# Patient Record
Sex: Female | Born: 1980
Health system: Southern US, Community
[De-identification: ages and names within clinical notes are randomized; demographics above are authoritative.]

## PROBLEM LIST (undated history)

## (undated) DIAGNOSIS — I10 Essential (primary) hypertension: Secondary | ICD-10-CM

## (undated) DIAGNOSIS — R87629 Unspecified abnormal cytological findings in specimens from vagina: Secondary | ICD-10-CM

## (undated) DIAGNOSIS — Z789 Other specified health status: Secondary | ICD-10-CM

## (undated) HISTORY — DX: Other specified health status: Z78.9

## (undated) HISTORY — DX: Unspecified abnormal cytological findings in specimens from vagina: R87.629

## (undated) HISTORY — DX: Essential (primary) hypertension: I10

---
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
## 2000-11-10 ENCOUNTER — Other Ambulatory Visit: Admission: RE | Admit: 2000-11-10 | Discharge: 2000-11-10 | Payer: Self-pay | Admitting: Family Medicine

## 2014-06-28 ENCOUNTER — Emergency Department (HOSPITAL_COMMUNITY)
Admission: EM | Admit: 2014-06-28 | Discharge: 2014-06-28 | Disposition: A | Discharge status: Home / Self Care | Payer: Self-pay | Attending: Emergency Medicine | Admitting: Emergency Medicine

## 2014-06-28 ENCOUNTER — Emergency Department (HOSPITAL_COMMUNITY): Payer: Self-pay

## 2014-06-28 ENCOUNTER — Encounter (HOSPITAL_COMMUNITY): Payer: Self-pay

## 2014-06-28 DIAGNOSIS — Y9289 Other specified places as the place of occurrence of the external cause: Secondary | ICD-10-CM | POA: Insufficient documentation

## 2014-06-28 DIAGNOSIS — X58XXXA Exposure to other specified factors, initial encounter: Secondary | ICD-10-CM | POA: Insufficient documentation

## 2014-06-28 DIAGNOSIS — Y9389 Activity, other specified: Secondary | ICD-10-CM | POA: Insufficient documentation

## 2014-06-28 DIAGNOSIS — Z720 Tobacco use: Secondary | ICD-10-CM | POA: Insufficient documentation

## 2014-06-28 DIAGNOSIS — S93402A Sprain of unspecified ligament of left ankle, initial encounter: Secondary | ICD-10-CM | POA: Insufficient documentation

## 2014-06-28 DIAGNOSIS — Y998 Other external cause status: Secondary | ICD-10-CM | POA: Insufficient documentation

## 2014-06-28 MED ORDER — NAPROXEN 500 MG PO TABS: 500.0000 mg | ORAL_TABLET | Freq: Two times a day (BID) | ORAL | Status: DC

## 2014-06-28 MED ORDER — NAPROXEN 500 MG PO TABS
500.0000 mg | ORAL_TABLET | Freq: Once | ORAL | Status: AC
  Administered 2014-06-28: 500 mg via ORAL
  Filled 2014-06-28: qty 1

## 2014-06-28 NOTE — ED Notes (Signed)
Pt presents with c/o left ankle pain that started last Friday night. Pt reports she noticed some pain around that time which had gone away and came back last night. Pt denies any injury to that ankle.

## 2014-06-28 NOTE — ED Provider Notes (Signed)
CSN: [id number]     Arrival date & time 06/28/14  2003 History  This chart was scribed for non-physician practitioner, Teresa Madura, PA-C, working with Mancel Bale, MD, by Modena Jansky, ED Scribe. This patient was seen in room WTR7/WTR7 and the patient's care was started at 9:17 PM.   Chief Complaint  Patient presents with  . Ankle Pain   Patient is a 34 y.o. female presenting with ankle pain. The history is provided by the patient. No language interpreter was used.  Ankle Pain Location:  Ankle Time since incident:  6 days Injury: yes   Mechanism of injury comment:  Ambulation Ankle location:  L ankle Pain details:    Radiates to:  Does not radiate   Severity:  Moderate   Onset quality:  Sudden   Duration:  6 days   Timing:  Constant   Progression:  Waxing and waning Chronicity:  New Dislocation: no   Foreign body present:  No foreign bodies Relieved by:  Nothing Worsened by:  Bearing weight Ineffective treatments:  Elevation Associated symptoms: swelling    HPI Comments: Teresa Sanchez is a 34 y.o. female who presents to the Emergency Department complaining of constant moderate left ankle pain that started about 6 days ago. She reports that she heard a "pop" sound while she was ambulating to her car 6 days ago. She states that the next day she had pain only present with ambulation. She reports that 4 days ago the pain improved until 3 days ago she had left ankle pain even without ambulating. She states that she noticed some left ankle swelling and a slight red color. She reports using elevation with minimal relief. She denies any prior injury to ankle.   History reviewed. No pertinent past medical history. Past Surgical History  Procedure Laterality Date  . Cesarean section     No family history on file. History  Substance Use Topics  . Smoking status: Current Every Day Smoker  . Smokeless tobacco: Not on file  . Alcohol Use: Yes     Comment: occasionally    OB  History    No data available      Review of Systems  Musculoskeletal: Positive for myalgias and arthralgias.  All other systems reviewed and are negative.   Allergies  Review of patient's allergies indicates no known allergies.  Home Medications   Prior to Admission medications   Medication Sig Start Date End Date Taking? Authorizing Provider  naproxen (NAPROSYN) 500 MG tablet Take 1 tablet (500 mg total) by mouth 2 (two) times daily. 06/28/14   Teresa Madura, PA-C   BP 136/89 mmHg  Pulse 117  Temp(Src) 98.8 F (37.1 C) (Oral)  Resp 18  Ht  (1.753 m)  Wt 280 lb (127.007 kg)  BMI 41.33 kg/m2  SpO2 99%  LMP 06/04/2014 (Approximate)  Physical Exam  Constitutional: She is oriented to person, place, and time. She appears well-developed and well-nourished. No distress.  HENT:  Head: Normocephalic and atraumatic.  Eyes: Conjunctivae and EOM are normal. No scleral icterus.  Neck: Normal range of motion.  Cardiovascular: Normal rate, regular rhythm and intact distal pulses.   DP and PT pulses 2+ in the left lower extremity  Pulmonary/Chest: Effort normal. No respiratory distress.  Musculoskeletal: Normal range of motion.       Left ankle: She exhibits swelling. She exhibits normal range of motion, no deformity, no laceration and normal pulse. Tenderness. Lateral malleolus tenderness found. Achilles tendon normal.  Left lower leg: Normal.       Left foot: There is tenderness. There is normal range of motion, no bony tenderness, no crepitus and no deformity.       Feet:  Neurological: She is alert and oriented to person, place, and time. She exhibits normal muscle tone. Coordination normal.  Sensation to light touch intact. Patient name Teresa Sanchez with steady gait. Patellar and Achilles reflexes intact. Patient able to wiggle all toes of left foot.  Skin: Skin is warm and dry. No rash noted. She is not diaphoretic. No erythema. No pallor.  Psychiatric: She has a normal mood  and affect. Her behavior is normal.  Nursing note and vitals reviewed.   ED Course  Procedures (including critical care time) DIAGNOSTIC STUDIES: Oxygen Saturation is 99% on RA, normal by my interpretation.    COORDINATION OF CARE: 9:21 PM- Pt advised of plan for treatment which includes medication and radiology and pt agrees.  Labs Review Labs Reviewed - No data to display  Imaging Review Dg Ankle Complete Left  06/28/2014   CLINICAL DATA:  Acute onset ankle pain and popping 5 days ago. Initial encounter.  EXAM: LEFT ANKLE COMPLETE - 3+ VIEW  COMPARISON:  None.  FINDINGS: There is no evidence of fracture, dislocation, or joint effusion. There is no evidence of arthropathy or other focal bone abnormality. Soft tissues are unremarkable.  IMPRESSION: Negative.   Electronically Signed   By: Myles RosenthalJohn  Stahl M.D.   On: 06/28/2014 20:36     EKG Interpretation None      MDM   Final diagnoses:  Left ankle sprain, initial encounter    34 year old female presents to the emergency department for persistent left ankle pain. Patient is neurovascularly intact. No evidence of septic joint. X-rays negative for fracture, dislocation, or deformity. Symptoms consistent with sprain of left ankle. Will treat with RICE and NSAIDs. ASO ankle brace given in ED. Orthopedic referral given should symptoms persist or worsen. Return precautions discussed and provided. Patient agreeable to plan with no unaddressed concerns. Patient discharged in good condition.  I personally performed the services described in this documentation, which was scribed in my presence. The recorded information has been reviewed and is accurate.    Teresa MaduraKelly Sophie Quiles, PA-C 06/28/14 2134  Mancel BaleElliott Wentz, MD 06/29/14 [phone]

## 2014-06-28 NOTE — Discharge Instructions (Signed)
Wear an ankle brace for stability. Ice your ankle 3-4 times per day. Try to elevate it as much as possible. Take naproxen for pain and swelling. Follow up with orthopedics if symptoms persist.  Ankle Sprain An ankle sprain is an injury to the strong, fibrous tissues (ligaments) that hold the bones of your ankle joint together.  CAUSES An ankle sprain is usually caused by a fall or by twisting your ankle. Ankle sprains most commonly occur when you step on the outer edge of your foot, and your ankle turns inward. People who participate in sports are more prone to these types of injuries.  SYMPTOMS   Pain in your ankle. The pain may be present at rest or only when you are trying to stand or walk.  Swelling.  Bruising. Bruising may develop immediately or within 1 to 2 days after your injury.  Difficulty standing or walking, particularly when turning corners or changing directions. DIAGNOSIS  Your caregiver will ask you details about your injury and perform a physical exam of your ankle to determine if you have an ankle sprain. During the physical exam, your caregiver will press on and apply pressure to specific areas of your foot and ankle. Your caregiver will try to move your ankle in certain ways. An X-ray exam may be done to be sure a bone was not broken or a ligament did not separate from one of the bones in your ankle (avulsion fracture).  TREATMENT  Certain types of braces can help stabilize your ankle. Your caregiver can make a recommendation for this. Your caregiver may recommend the use of medicine for pain. If your sprain is severe, your caregiver may refer you to a surgeon who helps to restore function to parts of your skeletal system (orthopedist) or a physical therapist. HOME CARE INSTRUCTIONS   Apply ice to your injury for 1-2 days or as directed by your caregiver. Applying ice helps to reduce inflammation and pain.  Put ice in a plastic bag.  Place a towel between your skin and  the bag.  Leave the ice on for 15-20 minutes at a time, every 2 hours while you are awake.  Only take over-the-counter or prescription medicines for pain, discomfort, or fever as directed by your caregiver.  Elevate your injured ankle above the level of your heart as much as possible for 2-3 days.  If your caregiver recommends crutches, use them as instructed. Gradually put weight on the affected ankle. Continue to use crutches or a cane until you can walk without feeling pain in your ankle.  If you have a plaster splint, wear the splint as directed by your caregiver. Do not rest it on anything harder than a pillow for the first 24 hours. Do not put weight on it. Do not get it wet. You may take it off to take a shower or bath.  You may have been given an elastic bandage to wear around your ankle to provide support. If the elastic bandage is too tight (you have numbness or tingling in your foot or your foot becomes cold and blue), adjust the bandage to make it comfortable.  If you have an air splint, you may blow more air into it or let air out to make it more comfortable. You may take your splint off at night and before taking a shower or bath. Wiggle your toes in the splint several times per day to decrease swelling. SEEK MEDICAL CARE IF:   You have rapidly increasing  bruising or swelling.  Your toes feel extremely cold or you lose feeling in your foot.  Your pain is not relieved with medicine. SEEK IMMEDIATE MEDICAL CARE IF:  Your toes are numb or blue.  You have severe pain that is increasing. MAKE SURE YOU:   Understand these instructions.  Will watch your condition.  Will get help right away if you are not doing well or get worse. Document Released: 03/16/2005 Document Revised: 12/09/2011 Document Reviewed: 03/28/2011 [facility] Patient Information 2015 [place], [place]. This information is not intended to replace advice given to you by your health care provider. Make sure you  discuss any questions you have with your health care provider.

## 2016-03-30 HISTORY — PX: LEEP: SHX91

## 2016-10-14 ENCOUNTER — Encounter (HOSPITAL_COMMUNITY): Payer: Self-pay

## 2016-11-26 ENCOUNTER — Encounter (HOSPITAL_COMMUNITY): Payer: Self-pay

## 2016-11-26 ENCOUNTER — Ambulatory Visit (HOSPITAL_COMMUNITY)
Admission: RE | Admit: 2016-11-26 | Discharge: 2016-11-26 | Disposition: A | Discharge status: Home / Self Care | Payer: Self-pay | Source: Ambulatory Visit | Attending: Obstetrics and Gynecology | Admitting: Obstetrics and Gynecology

## 2016-11-26 VITALS — BP 112/84 | Temp 98.4°F | Ht 69.0 in | Wt 291.0 lb

## 2016-11-26 DIAGNOSIS — R87810 Cervical high risk human papillomavirus (HPV) DNA test positive: Secondary | ICD-10-CM

## 2016-11-26 DIAGNOSIS — R87610 Atypical squamous cells of undetermined significance on cytologic smear of cervix (ASC-US): Secondary | ICD-10-CM

## 2016-11-26 DIAGNOSIS — Z1239 Encounter for other screening for malignant neoplasm of breast: Secondary | ICD-10-CM

## 2016-11-26 NOTE — Addendum Note (Signed)
Encounter addended by: Priscille HeidelbergBrannock, Christine P, RN on: 11/26/2016  3:11 PM<BR>    Actions taken: Follow-up modified, Sign clinical note

## 2016-11-26 NOTE — Progress Notes (Addendum)
[facility] due to having an abnormal Pap smear on 10/14/2016 that a colposcopy is recommended for follow up.  Pap Smear: Pap smear not completed today. [facility] and ASC[facility] with positive HPV. Patient [facility] a [facility]. Appointment scheduled for Thursday, December 03, 2016 at 1020. Per patient has a history of an abnormal Pap smear in 2004 that a colposcopy and LEEP was completed for follow up. Last Pap smear result is in EPIC.  Physical exam: Breasts Breasts symmetrical. No skin abnormalities bilateral breasts. No nipple retraction bilateral breasts. No nipple discharge bilateral breasts. No lymphadenopathy. No lumps palpated bilateral breasts. No complaints of pain or tenderness on exam. Screening mammogram recommended at age 36 unless clinically indicated prior.       Pelvic/Bimanual No Pap smear completed today since last Pap smear was 10/14/2016. Pap smear not indicated per BCCCP guidelines.   Smoking History: Patient is a current smoker. Discussed smoking cessation with patient. [facility] and gave resources for free [facility] classes at [facility]  Patient Navigation: Patient education provided. Access to services provided for patient through [facility].

## 2016-11-26 NOTE — Patient Instructions (Addendum)
Explained breast self awareness with Argie RammingJennifer L Moore. Patient did not need a Pap smear today due to last Pap smear was 10/14/2016. Explained the colposcopy the recommended follow up for her abnormal Pap smear. [facility] a [facility]ps[facility]. Appointment scheduled for Thursday, December 03, 2016 at 1020. Patient aware of appointment and will be there. Discussed smoking cessation with patient. Referred to the [facility] and gave resources for free [facility] cessation classes at [facility]. Let patient know that she will need a screening mammogram at age 36 unless clinically indicated prior. Argie RammingJennifer L Moore verbalized understanding.  Loreal Schuessler, Kathaleen Maserhristine Poll, RN 2:55 PM

## 2016-12-03 ENCOUNTER — Other Ambulatory Visit (HOSPITAL_COMMUNITY)
Admission: RE | Admit: 2016-12-03 | Discharge: 2016-12-03 | Disposition: A | Discharge status: Home / Self Care | Payer: Medicaid Other | Source: Ambulatory Visit | Attending: Obstetrics & Gynecology | Admitting: Obstetrics & Gynecology

## 2016-12-03 ENCOUNTER — Ambulatory Visit (INDEPENDENT_AMBULATORY_CARE_PROVIDER_SITE_OTHER): Payer: Self-pay | Admitting: Obstetrics & Gynecology

## 2016-12-03 ENCOUNTER — Encounter: Payer: Self-pay | Admitting: Obstetrics & Gynecology

## 2016-12-03 DIAGNOSIS — Z3202 Encounter for pregnancy test, result negative: Secondary | ICD-10-CM

## 2016-12-03 DIAGNOSIS — R87610 Atypical squamous cells of undetermined significance on cytologic smear of cervix (ASC-US): Secondary | ICD-10-CM | POA: Insufficient documentation

## 2016-12-03 DIAGNOSIS — N871 Moderate cervical dysplasia: Secondary | ICD-10-CM | POA: Diagnosis not present

## 2016-12-03 DIAGNOSIS — R87810 Cervical high risk human papillomavirus (HPV) DNA test positive: Secondary | ICD-10-CM | POA: Insufficient documentation

## 2016-12-03 LAB — POCT PREGNANCY, URINE: Preg Test, Ur: NEGATIVE

## 2016-12-03 NOTE — Patient Instructions (Signed)
Colposcopy, Care After  This sheet gives you information about how to care for yourself after your procedure. Your doctor may also give you more specific instructions. If you have problems or questions, contact your doctor.  What can I expect after the procedure?  If you did not have a tissue sample removed (did not have a biopsy), you may only have some spotting for a few days. You can go back to your normal activities.  If you had a tissue sample removed, it is common to have:  · Soreness and pain. This may last for a few days.  · Light-headedness.  · Mild bleeding from your vagina or dark-colored, grainy discharge from your vagina. This may last for a few days. You may need to wear a sanitary pad.  · Spotting for at least 48 hours after the procedure.    Follow these instructions at home:  · Take over-the-counter and prescription medicines only as told by your doctor. Ask your doctor what medicines you can start taking again. This is very important if you take blood-thinning medicine.  · Do not drive or use heavy machinery while taking prescription pain medicine.  · For 3 days, or as long as your doctor tells you, avoid:  ? Douching.  ? Using tampons.  ? Having sex.  · If you use birth control (contraception), keep using it.  · Limit activity for the first day after the procedure. Ask your doctor what activities are safe for you.  · It is up to you to get the results of your procedure. Ask your doctor when your results will be ready.  · Keep all follow-up visits as told by your doctor. This is important.  Contact a doctor if:  · You get a skin rash.  Get help right away if:  · You are bleeding a lot from your vagina. It is a lot of bleeding if you are using more than one pad an hour for 2 hours in a row.  · You have clumps of blood (blood clots) coming from your vagina.  · You have a fever.  · You have chills  · You have pain in your lower belly (pelvic area).  · You have signs of infection, such as vaginal  discharge that is:  ? Different than usual.  ? Yellow.  ? Bad-smelling.  · You have very pain or cramps in your lower belly that do not get better with medicine.  · You feel light-headed.  · You feel dizzy.  · You pass out (faint).  Summary  · If you did not have a tissue sample removed (did not have a biopsy), you may only have some spotting for a few days. You can go back to your normal activities.  · If you had a tissue sample removed, it is common to have mild pain and spotting for 48 hours.  · For 3 days, or as long as your doctor tells you, avoid douching, using tampons and having sex.  · Get help right away if you have bleeding, very bad pain, or signs of infection.  This information is not intended to replace advice given to you by your health care provider. Make sure you discuss any questions you have with your health care provider.  Document Released: 09/02/2007 Document Revised: 12/04/2015 Document Reviewed: 12/04/2015  Elsevier Interactive Patient Education © 2018 [employer]vier Inc.

## 2016-12-03 NOTE — Progress Notes (Signed)
Patient ID: Teresa RammingJennifer L Sanchez, female   DOB: 08-10-1980, 36 y.o.   MRN: [id number]  Chief Complaint  Patient presents with  . Colposcopy    HPI Teresa RammingJennifer L Sanchez is a 36 y.o. female.  [id number] No LMP recorded.  HPI  Indications: Pap smear on July 2018 showed: ASCUS with POSITIVE high risk HPV. Previous colposcopy: in 2005. Prior cervical treatment: LLETZ.  No past medical history on file.  Past Surgical History:  Procedure Laterality Date  . CESAREAN SECTION      No family history on file.  Social History Social History  Substance Use Topics  . Smoking status: Current Every Day Smoker    Types: Cigarettes  . Smokeless tobacco: Never Used  . Alcohol use Yes     Comment: occasionally     No Known Allergies  Current Outpatient Prescriptions  Medication Sig Dispense Refill  . naproxen (NAPROSYN) 500 MG tablet Take 1 tablet (500 mg total) by mouth 2 (two) times daily. (Patient not taking: Reported on 12/03/2016) 30 tablet 0   No current facility-administered medications for this visit.     Review of Systems Review of Systems  Blood pressure 126/74, pulse 71, weight 287 lb 3.2 oz (130.3 kg).  Physical Exam Physical Exam  Data Reviewed Pap result  Assessment    Procedure Details  The risks and benefits of the procedure and Written informed consent obtained.  Speculum placed in vagina and excellent visualization of cervix achieved, cervix swabbed x 3 with acetic acid solution. TZ and SCJ seen minimal lesion at 9, no endocervical lesion Specimens: ECC and Bx 9   Complications: none.     Plan    Specimens labelled and sent to Pathology. Call to discuss Pathology results in 2 weeks.      Scheryl DarterJames Arnold 12/03/2016, 11:44 AM

## 2016-12-16 ENCOUNTER — Encounter: Payer: Self-pay | Admitting: General Practice

## 2016-12-16 ENCOUNTER — Telehealth: Payer: Self-pay | Admitting: General Practice

## 2016-12-16 NOTE — Telephone Encounter (Signed)
-----   Message from Adam Phenix, MD sent at 12/15/2016 11:51 AM EDT ----- CIN 2, patient needs LEEP

## 2016-12-16 NOTE — Telephone Encounter (Signed)
Called patient & informed her of results & recommendations. Asked patient if anyone has explained that procedure to her before. Patient states she has had one previously. Told patient I do not have an appt set up for her but someone from our front office staff will reach out to her with an appt. Patient verbalized understanding & had no questions

## 2017-01-07 ENCOUNTER — Ambulatory Visit: Payer: No Typology Code available for payment source | Admitting: Obstetrics & Gynecology

## 2017-01-15 ENCOUNTER — Ambulatory Visit (INDEPENDENT_AMBULATORY_CARE_PROVIDER_SITE_OTHER): Payer: Medicaid Other | Admitting: Obstetrics and Gynecology

## 2017-01-15 ENCOUNTER — Other Ambulatory Visit (HOSPITAL_COMMUNITY)
Admission: RE | Admit: 2017-01-15 | Discharge: 2017-01-15 | Disposition: A | Discharge status: Home / Self Care | Payer: Medicaid Other | Source: Ambulatory Visit | Attending: Obstetrics & Gynecology | Admitting: Obstetrics & Gynecology

## 2017-01-15 ENCOUNTER — Encounter: Payer: Self-pay | Admitting: Obstetrics and Gynecology

## 2017-01-15 DIAGNOSIS — N871 Moderate cervical dysplasia: Secondary | ICD-10-CM | POA: Diagnosis present

## 2017-01-15 DIAGNOSIS — Z3202 Encounter for pregnancy test, result negative: Secondary | ICD-10-CM

## 2017-01-15 LAB — POCT PREGNANCY, URINE: Preg Test, Ur: NEGATIVE

## 2017-01-15 NOTE — Addendum Note (Signed)
Addended by: Garret ReddishBARNES, Jossalin Chervenak M on: 01/15/2017 12:34 PM   Modules accepted: Orders

## 2017-01-15 NOTE — Progress Notes (Addendum)
   GYNECOLOGY OFFICE PROCEDURE NOTE  Teresa RammingJennifer L Sanchez is a 36 y.o. G2P1011 here for LEEP. No GYN concerns. Pap smear and colposcopy reviewed.    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  Role of HPV,cervical dysplasia and need for close followup was empasized. Informed written consent was obtained. All questions were answered. Time out performed. Urine pregnancy test was negative.  Procedure: The patient was placed in lithotomy position and the bivalved coated speculum was placed in the patient's vagina. A grounding pad placed on the patient. Lugol's solution was applied to the cervix and areas of decreased uptake were noted around the transformation zone.   Local anesthesia was administered via an intracervical block using 20 ml of 1% Lidocaine with epinephrine. The suction was turned on and the Small 1X Fisher Cone Biopsy Excisor on 40 Watts of blended current was used to excise the area of decreased uptake and excise the entire transformation zone. Small amount of bleeding noted at site of cone and hemostasis was achieved using roller ball coagulation set at 60 Watts coagulation current and with application of Monsel's solution. The speculum was removed from the vagina. Specimens were sent to pathology.  The patient tolerated the procedure well. Post-operative instructions given to patient, including instruction to seek medical attention for persistent bright red bleeding, fever, abdominal/pelvic pain, dysuria, nausea or vomiting. She was also told about the possibility of having copious yellow to black tinged discharge for weeks. She was counseled to avoid anything in the vagina  (sex/douching/tampons) for 2 weeks. She has a 2 week post-operative check to assess wound healing, review results and discuss further management.    Baldemar LenisK. Meryl Davis, M.D. Attending Obstetrician & Gynecologist, [facility], [facility]

## 2017-02-03 ENCOUNTER — Encounter: Payer: Self-pay | Admitting: Obstetrics and Gynecology

## 2017-02-03 ENCOUNTER — Ambulatory Visit (INDEPENDENT_AMBULATORY_CARE_PROVIDER_SITE_OTHER): Payer: Medicaid Other | Admitting: Obstetrics and Gynecology

## 2017-02-03 VITALS — BP 122/73 | HR 74 | Wt 284.1 lb

## 2017-02-03 DIAGNOSIS — N870 Mild cervical dysplasia: Secondary | ICD-10-CM

## 2017-02-03 DIAGNOSIS — Z3009 Encounter for other general counseling and advice on contraception: Secondary | ICD-10-CM

## 2017-02-03 DIAGNOSIS — Z9889 Other specified postprocedural states: Secondary | ICD-10-CM

## 2017-02-03 MED ORDER — NORETHINDRONE 0.35 MG PO TABS: 1.0000 | ORAL_TABLET | Freq: Every day | ORAL | 11 refills | Status: DC

## 2017-02-03 NOTE — Patient Instructions (Signed)
[facility]  ° ° °[facility]       Phone: [phone] ° °[facility] [place] Phone: [phone] ° °[facility] [place]  Phone: [phone] ° °[facility] [place]  Phone: [phone] ° °[facility] [place]  Phone: [phone] ° °[facility]       Phone: [phone] ° °[facility]    Phone: [phone]  ° °[facility] ([place]    Phone: [phone] ° °[facility]    Phone: [phone] ° °[facility]    Phone: [phone] ° °[facility]    Phone: [phone] ° °[facility]       Phone: [phone]  ° °[facility]  Phone: [phone] ° °[facility]    Phone: [phone] ° °[facility]   Phone: [phone] ° °[facility]      Phone: [phone] ° °[facility]    Phone: [phone] ° °

## 2017-02-03 NOTE — Progress Notes (Signed)
GYNECOLOGY OFFICE VISIT NOTE  History:  36 y.o. G2P0011 here today for results s/p LEEP two weeks ago. She had bleeding for several days after and then started her period. She denies any abnormal vaginal discharge, pelvic pain or other concerns. Bowel and bladder habits back to normal. Pain has been minimal and overall doing well. Had unprotected intercourse this am.   No past medical history on file.  Past Surgical History:  Procedure Laterality Date  . CESAREAN SECTION    . LEEP  2018     Current Outpatient Medications:  .  naproxen (NAPROSYN) 500 MG tablet, Take 1 tablet (500 mg total) by mouth 2 (two) times daily. (Patient not taking: Reported on 12/03/2016), Disp: 30 tablet, Rfl: 0 .  nicotine (NICODERM CQ - DOSED IN MG/24 HOURS) 21 mg/24hr patch, Place 21 mg onto the skin daily., Disp: , Rfl:  .  norethindrone (MICRONOR,CAMILA,ERRIN) 0.35 MG tablet, Take 1 tablet (0.35 mg total) daily by mouth., Disp: 1 Package, Rfl: 11  The following portions of the patient's history were reviewed and updated as appropriate: allergies, current medications, past family history, past medical history, past social history, past surgical history and problem list.   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   Objective:  Physical Exam BP 122/73   Pulse 74   Wt 284 lb 1.6 oz (128.9 kg)   BMI 41.95 kg/m  CONSTITUTIONAL: Well-developed, well-nourished female in no acute distress.  HENT:  Normocephalic, atraumatic. External right and left ear normal. Oropharynx is clear and moist EYES: Conjunctivae and EOM are normal. Pupils are equal, round, and reactive to light. No scleral icterus.  NECK: Normal range of motion, supple, no masses SKIN: Skin is warm and dry. No rash noted. Not diaphoretic. No erythema. No pallor. NEUROLOGIC: Alert and oriented to  person, place, and time. Normal reflexes, muscle tone coordination. No cranial nerve deficit noted. PSYCHIATRIC: Normal mood and affect. Normal behavior. Normal judgment and thought content. CARDIOVASCULAR: Normal heart rate noted RESPIRATORY: Effort normal, no problems with respiration noted ABDOMEN: Soft, no distention noted.   PELVIC: normal appearing external female genitalia, cervix appearing to heal well with no areas of bleeding, necrotic appearing or granulation tissue seen MUSCULOSKELETAL: Normal range of motion. No edema noted.  Labs and Imaging No results found.  Diagnosis Patient: Teresa Sanchez, Teresa Sanchez Client: [facility] Accession: [id number] Received: 01/18/2017 Tresa EndoKelly "Meryl" Earlene Plateravis, MD DOB: Aug 26, 1980 Age: [age] Gender: F Reported: 01/19/2017 [address] Patient Ph: [phone] MRN #: [id number] [place] [id number] Visit #: [id number] Chart #: Phone: [phone] Fax: CC: REPORT OF SURGICAL PATHOLOGY FINAL DIAGNOSIS Diagnosis Cervix, LEEP - LOW GRADE SQUAMOUS INTRAEPITHELIAL LESION, CIN-I (MILD DYSPLASIA). - MARGINS NOT INVOLVED. Jimmy PicketJOHN PATRICK MD Pathologist, Electronic Signature (Case signed 01/19/2017)  Assessment & Plan:  1. Post-operative state Healing well Okay to resume intercourse  2. CIN I (cervical intraepithelial neoplasia I) Reviewed pathology and follow up, need for repeat pap 12/2017 and 12/2018. Emphasized importance of following up.   Per Up To Date "Based on the majority of the available evidence, a short conization to conception interval does not appear to increase the risk of preterm birth."  Reviewed that she should abstain from attempting pregnancy for 3-4 months to let cervix heal well, she had unprotected intercourse this am. Counseled regarding contraception options as she  would like ot get pregnant ASAP. POP recommended given her risk factors of smoking, obesity and short desired  interval to conception and she is agreeable. To start with next period as she had unprotected intercourse today.  3. Encounter for other general counseling or advice on contraception See above   Routine preventative health maintenance measures emphasized. Please refer to After Visit Summary for other counseling recommendations.   Return if symptoms worsen or fail to improve.    Baldemar LenisK. Meryl Tristan Bramble, M.D. Attending Obstetrician & Gynecologist, [facility], [facility]

## 2019-03-16 DIAGNOSIS — Z3201 Encounter for pregnancy test, result positive: Secondary | ICD-10-CM | POA: Diagnosis not present

## 2019-04-06 ENCOUNTER — Ambulatory Visit (INDEPENDENT_AMBULATORY_CARE_PROVIDER_SITE_OTHER): Payer: Medicaid Other

## 2019-04-06 DIAGNOSIS — O09529 Supervision of elderly multigravida, unspecified trimester: Secondary | ICD-10-CM | POA: Insufficient documentation

## 2019-04-06 DIAGNOSIS — O99331 Smoking (tobacco) complicating pregnancy, first trimester: Secondary | ICD-10-CM | POA: Insufficient documentation

## 2019-04-06 MED ORDER — BLOOD PRESSURE KIT DEVI: 1.0000 | 0 refills | Status: DC | PRN

## 2019-04-06 NOTE — Progress Notes (Addendum)
  Virtual Visit via Telephone Note  I connected with [name] on 04/06/19 at  8:30 AM EST by telephone and verified that I am speaking with the correct person using two identifiers.  Location: Patient: Teresa Sanchez  Provider: Eduard Roux, CMA    I discussed the limitations, risks, security and privacy concerns of performing an evaluation and management service by telephone and the availability of in person appointments. I also discussed with the patient that there may be a patient responsible charge related to this service. The patient expressed understanding and agreed to proceed.   History of Present Illness: PRENATAL INTAKE SUMMARY  Ms. Christell Constant presents today New OB Nurse Interview.  OB History    Gravida  3   Para  1   Term  1   Preterm      AB  1   Living  1     SAB      TAB  1   Ectopic      Multiple      Live Births  1          I have reviewed the patient's medical, obstetrical, social, and family histories, medications, and available lab results.  SUBJECTIVE She has no unusual complaints   Observations/Objective: Initial nurse interview for history/labs (New OB)  EDD: 11/16/2019 GA: [redacted]w[redacted]d G3P1011 FHT: non face to face interview   GENERAL APPEARANCE: alert, non face to face interview   Assessment and Plan: High Risk pregnancy -  AMA, smoker  Smoking Cessation info given   Prenatal Care - [facility]  Labs to be completed at provider visit BP cuff ordered today Pt to download Babyscripts explained in detail  Mychart link sent for sign up     Follow Up Instructions:   I discussed the assessment and treatment plan with the patient. The patient was provided an opportunity to ask questions and all were answered. The patient agreed with the plan and demonstrated an understanding of the instructions.   The patient was advised to call back or seek an in-person evaluation if the symptoms worsen or if the condition fails to improve as  anticipated.  I provided 20 minutes of non-face-to-face time during this encounter.   Dalphine Handing, CMA

## 2019-04-06 NOTE — Patient Instructions (Signed)
Pregnancy and Smoking Smoking during pregnancy is unhealthy for you and your baby. Smoke from cigarettes, e-cigarettes, pipes, and cigars contains many chemicals that can cause cancer (carcinogens). These products also contain a stimulant drug (nicotine). When you smoke, harmful substances that you breathe in enter your bloodstream and can be passed on to your baby. This can affect your baby's development. If you are planning to become pregnant or have recently become pregnant, talk with your health care provider about quitting smoking. You have a much better chance of having a healthy pregnancy and a healthy baby if you do not smoke while you are pregnant. How does smoking affect me? Smoking increases your risk for many long-term (chronic) diseases. These diseases include cancer, lung diseases, and heart disease. Smoking during pregnancy increases your risk of:  Losing the pregnancy (miscarriage or stillbirth).  Giving birth too early (premature birth).  Pregnancy outside of the uterus (tubal pregnancy).  Problems with the placenta, which is the organ that provides the baby nourishment and oxygen. These problems may include: ? Attachment of the placenta over the opening of the uterus (placenta previa). ? Detachment of the placenta before the baby's birth (placental abruption).  Having your water break before labor begins. How does smoking affect my baby? Before birth Smoking during pregnancy:  Decreases blood flow and oxygen to your baby.  Increases your baby's risk of birth defects, such as heart defects.  Increases your baby's heart rate.  Slows your baby's growth in the uterus (intrauterine growth retardation). After birth Babies born to women who smoked during pregnancy may:  Have symptoms of nicotine withdrawal.  Be born with a cleft lip, cleft palate, or other facial deformities.  Be too small at birth.  Have a high risk of: ? Serious health problems or lifelong  disabilities. These may result in the long-term need for certain medicines, therapies, or other treatments. ? Sudden infant death syndrome (SIDS). Follow these instructions at home:   Do not use any products that contain nicotine or tobacco, such as cigarettes, e-cigarettes, and chewing tobacco. If you need help quitting, ask your health care provider.  Talk with your health care provider about support strategies to quit smoking. Some methods to consider include: ? Counseling (smoking cessation counseling). ? Psychotherapy. ? Acupuncture. ? Hypnosis. ? Telephone hotlines for people trying to quit.  Do not take nicotine supplements or medicine to help you quit smoking unless your health care provider tells you to do so.  Avoid secondhand smoke. Ask people who smoke to avoid smoking around you.  Identify people, places, things, and activities that make you want to smoke (triggers). Avoid them. Where to find more information Learn more about smoking during pregnancy and quitting smoking from:  March of Dimes: www.marchofdimes.org  U.S. Department of Health and Human Services: women.smokefree.gov  American Cancer Society: www.cancer.org  American Heart Association: www.heart.org  National Cancer Institute: www.cancer.gov For help to quit smoking:  National smoking cessation telephone hotline: 1-800-QUIT NOW (784-8669) Contact a health care provider if:  You are struggling to quit smoking.  You are a smoker and you become pregnant or plan to become pregnant.  You start smoking again after giving birth. Summary  Smoking during pregnancy is unhealthy for you and your baby.  Tobacco smoke contains harmful substances that can affect a baby's health and development.  Smoking increases the risk for serious problems, such as miscarriage, birth defects, or premature birth.  If you need help to quit smoking, talk to your health   care provider and ask about support strategies such as  counseling. This information is not intended to replace advice given to you by your health care provider. Make sure you discuss any questions you have with your health care provider. Document Revised: 10/14/2018 Document Reviewed: 10/14/2018 Elsevier Patient Education  2020 [employer]  

## 2019-04-06 NOTE — Progress Notes (Signed)
I have reviewed this chart and agree with the RN/CMA assessment and management.    K. Meryl Erika Slaby, M.D. Attending [facility] ([facility]ulty Practice)   

## 2019-04-20 DIAGNOSIS — Z3400 Encounter for supervision of normal first pregnancy, unspecified trimester: Secondary | ICD-10-CM | POA: Diagnosis not present

## 2019-04-24 ENCOUNTER — Other Ambulatory Visit (HOSPITAL_COMMUNITY)
Admission: RE | Admit: 2019-04-24 | Discharge: 2019-04-24 | Disposition: A | Discharge status: Home / Self Care | Payer: Medicaid Other | Source: Ambulatory Visit | Attending: Obstetrics and Gynecology | Admitting: Obstetrics and Gynecology

## 2019-04-24 ENCOUNTER — Other Ambulatory Visit: Payer: Self-pay

## 2019-04-24 ENCOUNTER — Encounter: Payer: Self-pay | Admitting: Obstetrics and Gynecology

## 2019-04-24 ENCOUNTER — Ambulatory Visit (INDEPENDENT_AMBULATORY_CARE_PROVIDER_SITE_OTHER): Payer: Medicaid Other | Admitting: Obstetrics and Gynecology

## 2019-04-24 VITALS — BP 124/76 | HR 92 | Wt 290.0 lb

## 2019-04-24 DIAGNOSIS — O09529 Supervision of elderly multigravida, unspecified trimester: Secondary | ICD-10-CM | POA: Insufficient documentation

## 2019-04-24 DIAGNOSIS — O99210 Obesity complicating pregnancy, unspecified trimester: Secondary | ICD-10-CM | POA: Insufficient documentation

## 2019-04-24 DIAGNOSIS — O09521 Supervision of elderly multigravida, first trimester: Secondary | ICD-10-CM

## 2019-04-24 DIAGNOSIS — O99331 Smoking (tobacco) complicating pregnancy, first trimester: Secondary | ICD-10-CM

## 2019-04-24 DIAGNOSIS — Z3A10 10 weeks gestation of pregnancy: Secondary | ICD-10-CM

## 2019-04-24 DIAGNOSIS — O99211 Obesity complicating pregnancy, first trimester: Secondary | ICD-10-CM

## 2019-04-24 DIAGNOSIS — O34219 Maternal care for unspecified type scar from previous cesarean delivery: Secondary | ICD-10-CM | POA: Insufficient documentation

## 2019-04-24 MED ORDER — PROMETHAZINE HCL 25 MG PO TABS
25.0000 mg | ORAL_TABLET | Freq: Four times a day (QID) | ORAL | 2 refills | Status: DC | PRN
Start: 2019-04-24 — End: 2019-10-03

## 2019-04-24 MED ORDER — ASPIRIN EC 81 MG PO TBEC: 81.0000 mg | DELAYED_RELEASE_TABLET | Freq: Every day | ORAL | 2 refills | Status: DC

## 2019-04-24 NOTE — Patient Instructions (Signed)
 First Trimester of Pregnancy The first trimester of pregnancy is from week 1 until the end of week 13 (months 1 through 3). A week after a sperm fertilizes an egg, the egg will implant on the wall of the uterus. This embryo will begin to develop into a baby. Genes from you and your partner will form the baby. The female genes will determine whether the baby will be a boy or a girl. At 6-8 weeks, the eyes and face will be formed, and the heartbeat can be seen on ultrasound. At the end of 12 weeks, all the baby's organs will be formed. Now that you are pregnant, you will want to do everything you can to have a healthy baby. Two of the most important things are to get good prenatal care and to follow your health care provider's instructions. Prenatal care is all the medical care you receive before the baby's birth. This care will help prevent, find, and treat any problems during the pregnancy and childbirth. Body changes during your first trimester Your body goes through many changes during pregnancy. The changes vary from woman to woman.  You may gain or lose a couple of pounds at first.  You may feel sick to your stomach (nauseous) and you may throw up (vomit). If the vomiting is uncontrollable, call your health care provider.  You may tire easily.  You may develop headaches that can be relieved by medicines. All medicines should be approved by your health care provider.  You may urinate more often. Painful urination may mean you have a bladder infection.  You may develop heartburn as a result of your pregnancy.  You may develop constipation because certain hormones are causing the muscles that push stool through your intestines to slow down.  You may develop hemorrhoids or swollen veins (varicose veins).  Your breasts may begin to grow larger and become tender. Your nipples may stick out more, and the tissue that surrounds them (areola) may become darker.  Your gums may bleed and may be  sensitive to brushing and flossing.  Dark spots or blotches (chloasma, mask of pregnancy) may develop on your face. This will likely fade after the baby is born.  Your menstrual periods will stop.  You may have a loss of appetite.  You may develop cravings for certain kinds of food.  You may have changes in your emotions from day to day, such as being excited to be pregnant or being concerned that something may go wrong with the pregnancy and baby.  You may have more vivid and strange dreams.  You may have changes in your hair. These can include thickening of your hair, rapid growth, and changes in texture. Some women also have hair loss during or after pregnancy, or hair that feels dry or thin. Your hair will most likely return to normal after your baby is born. What to expect at prenatal visits During a routine prenatal visit:  You will be weighed to make sure you and the baby are growing normally.  Your blood pressure will be taken.  Your abdomen will be measured to track your baby's growth.  The fetal heartbeat will be listened to between weeks 10 and 14 of your pregnancy.  Test results from any previous visits will be discussed. Your health care provider may ask you:  How you are feeling.  If you are feeling the baby move.  If you have had any abnormal symptoms, such as leaking fluid, bleeding, severe headaches, or   abdominal cramping.  If you are using any tobacco products, including cigarettes, chewing tobacco, and electronic cigarettes.  If you have any questions. Other tests that may be performed during your first trimester include:  Blood tests to find your blood type and to check for the presence of any previous infections. The tests will also be used to check for low iron levels (anemia) and protein on red blood cells (Rh antibodies). Depending on your risk factors, or if you previously had diabetes during pregnancy, you may have tests to check for high blood sugar  that affects pregnant women (gestational diabetes).  Urine tests to check for infections, diabetes, or protein in the urine.  An ultrasound to confirm the proper growth and development of the baby.  Fetal screens for spinal cord problems (spina bifida) and Down syndrome.  HIV (human immunodeficiency virus) testing. Routine prenatal testing includes screening for HIV, unless you choose not to have this test.  You may need other tests to make sure you and the baby are doing well. Follow these instructions at home: Medicines  Follow your health care provider's instructions regarding medicine use. Specific medicines may be either safe or unsafe to take during pregnancy.  Take a prenatal vitamin that contains at least 600 micrograms (mcg) of folic acid.  If you develop constipation, try taking a stool softener if your health care provider approves. Eating and drinking   Eat a balanced diet that includes fresh fruits and vegetables, whole grains, good sources of protein such as meat, eggs, or tofu, and low-fat dairy. Your health care provider will help you determine the amount of weight gain that is right for you.  Avoid raw meat and uncooked cheese. These carry germs that can cause birth defects in the baby.  Eating four or five small meals rather than three large meals a day may help relieve nausea and vomiting. If you start to feel nauseous, eating a few soda crackers can be helpful. Drinking liquids between meals, instead of during meals, also seems to help ease nausea and vomiting.  Limit foods that are high in fat and processed sugars, such as fried and sweet foods.  To prevent constipation: ? Eat foods that are high in fiber, such as fresh fruits and vegetables, whole grains, and beans. ? Drink enough fluid to keep your urine clear or pale yellow. Activity  Exercise only as directed by your health care provider. Most women can continue their usual exercise routine during  pregnancy. Try to exercise for 30 minutes at least 5 days a week. Exercising will help you: ? Control your weight. ? Stay in shape. ? Be prepared for labor and delivery.  Experiencing pain or cramping in the lower abdomen or lower back is a good sign that you should stop exercising. Check with your health care provider before continuing with normal exercises.  Try to avoid standing for long periods of time. Move your legs often if you must stand in one place for a long time.  Avoid heavy lifting.  Wear low-heeled shoes and practice good posture.  You may continue to have sex unless your health care provider tells you not to. Relieving pain and discomfort  Wear a good support bra to relieve breast tenderness.  Take warm sitz baths to soothe any pain or discomfort caused by hemorrhoids. Use hemorrhoid cream if your health care provider approves.  Rest with your legs elevated if you have leg cramps or low back pain.  If you develop varicose veins   in your legs, wear support hose. Elevate your feet for 15 minutes, 3-4 times a day. Limit salt in your diet. Prenatal care  Schedule your prenatal visits by the twelfth week of pregnancy. They are usually scheduled monthly at first, then more often in the last 2 months before delivery.  Write down your questions. Take them to your prenatal visits.  Keep all your prenatal visits as told by your health care provider. This is important. Safety  Wear your seat belt at all times when driving.  Make a list of emergency phone numbers, including numbers for family, friends, the hospital, and police and fire departments. General instructions  Ask your health care provider for a referral to a local prenatal education class. Begin classes no later than the beginning of month 6 of your pregnancy.  Ask for help if you have counseling or nutritional needs during pregnancy. Your health care provider can offer advice or refer you to specialists for help  with various needs.  Do not use hot tubs, steam rooms, or saunas.  Do not douche or use tampons or scented sanitary pads.  Do not cross your legs for long periods of time.  Avoid cat litter boxes and soil used by cats. These carry germs that can cause birth defects in the baby and possibly loss of the fetus by miscarriage or stillbirth.  Avoid all smoking, herbs, alcohol, and medicines not prescribed by your health care provider. Chemicals in these products affect the formation and growth of the baby.  Do not use any products that contain nicotine or tobacco, such as cigarettes and e-cigarettes. If you need help quitting, ask your health care provider. You may receive counseling support and other resources to help you quit.  Schedule a dentist appointment. At home, brush your teeth with a soft toothbrush and be gentle when you floss. Contact a health care provider if:  You have dizziness.  You have mild pelvic cramps, pelvic pressure, or nagging pain in the abdominal area.  You have persistent nausea, vomiting, or diarrhea.  You have a bad smelling vaginal discharge.  You have pain when you urinate.  You notice increased swelling in your face, hands, legs, or ankles.  You are exposed to fifth disease or chickenpox.  You are exposed to German measles (rubella) and have never had it. Get help right away if:  You have a fever.  You are leaking fluid from your vagina.  You have spotting or bleeding from your vagina.  You have severe abdominal cramping or pain.  You have rapid weight gain or loss.  You vomit blood or material that looks like coffee grounds.  You develop a severe headache.  You have shortness of breath.  You have any kind of trauma, such as from a fall or a car accident. Summary  The first trimester of pregnancy is from week 1 until the end of week 13 (months 1 through 3).  Your body goes through many changes during pregnancy. The changes vary from  woman to woman.  You will have routine prenatal visits. During those visits, your health care provider will examine you, discuss any test results you may have, and talk with you about how you are feeling. This information is not intended to replace advice given to you by your health care provider. Make sure you discuss any questions you have with your health care provider. Document Revised: 02/26/2017 Document Reviewed: 02/26/2016 [employer] Patient Education  2020 [employer]   Second Trimester of   Pregnancy The second trimester is from week 14 through week 27 (months 4 through 6). The second trimester is often a time when you feel your best. Your body has adjusted to being pregnant, and you begin to feel better physically. Usually, morning sickness has lessened or quit completely, you may have more energy, and you may have an increase in appetite. The second trimester is also a time when the fetus is growing rapidly. At the end of the sixth month, the fetus is about 9 inches long and weighs about 1 pounds. You will likely begin to feel the baby move (quickening) between 16 and 20 weeks of pregnancy. Body changes during your second trimester Your body continues to go through many changes during your second trimester. The changes vary from woman to woman.  Your weight will continue to increase. You will notice your lower abdomen bulging out.  You may begin to get stretch marks on your hips, abdomen, and breasts.  You may develop headaches that can be relieved by medicines. The medicines should be approved by your health care provider.  You may urinate more often because the fetus is pressing on your bladder.  You may develop or continue to have heartburn as a result of your pregnancy.  You may develop constipation because certain hormones are causing the muscles that push waste through your intestines to slow down.  You may develop hemorrhoids or swollen, bulging veins (varicose  veins).  You may have back pain. This is caused by: ? Weight gain. ? Pregnancy hormones that are relaxing the joints in your pelvis. ? A shift in weight and the muscles that support your balance.  Your breasts will continue to grow and they will continue to become tender.  Your gums may bleed and may be sensitive to brushing and flossing.  Dark spots or blotches (chloasma, mask of pregnancy) may develop on your face. This will likely fade after the baby is born.  A dark line from your belly button to the pubic area (linea nigra) may appear. This will likely fade after the baby is born.  You may have changes in your hair. These can include thickening of your hair, rapid growth, and changes in texture. Some women also have hair loss during or after pregnancy, or hair that feels dry or thin. Your hair will most likely return to normal after your baby is born. What to expect at prenatal visits During a routine prenatal visit:  You will be weighed to make sure you and the fetus are growing normally.  Your blood pressure will be taken.  Your abdomen will be measured to track your baby's growth.  The fetal heartbeat will be listened to.  Any test results from the previous visit will be discussed. Your health care provider may ask you:  How you are feeling.  If you are feeling the baby move.  If you have had any abnormal symptoms, such as leaking fluid, bleeding, severe headaches, or abdominal cramping.  If you are using any tobacco products, including cigarettes, chewing tobacco, and electronic cigarettes.  If you have any questions. Other tests that may be performed during your second trimester include:  Blood tests that check for: ? Low iron levels (anemia). ? High blood sugar that affects pregnant women (gestational diabetes) between 24 and 28 weeks. ? Rh antibodies. This is to check for a protein on red blood cells (Rh factor).  Urine tests to check for infections,  diabetes, or protein in the urine.    An ultrasound to confirm the proper growth and development of the baby.  An amniocentesis to check for possible genetic problems.  Fetal screens for spina bifida and Down syndrome.  HIV (human immunodeficiency virus) testing. Routine prenatal testing includes screening for HIV, unless you choose not to have this test. Follow these instructions at home: Medicines  Follow your health care provider's instructions regarding medicine use. Specific medicines may be either safe or unsafe to take during pregnancy.  Take a prenatal vitamin that contains at least 600 micrograms (mcg) of folic acid.  If you develop constipation, try taking a stool softener if your health care provider approves. Eating and drinking   Eat a balanced diet that includes fresh fruits and vegetables, whole grains, good sources of protein such as meat, eggs, or tofu, and low-fat dairy. Your health care provider will help you determine the amount of weight gain that is right for you.  Avoid raw meat and uncooked cheese. These carry germs that can cause birth defects in the baby.  If you have low calcium intake from food, talk to your health care provider about whether you should take a daily calcium supplement.  Limit foods that are high in fat and processed sugars, such as fried and sweet foods.  To prevent constipation: ? Drink enough fluid to keep your urine clear or pale yellow. ? Eat foods that are high in fiber, such as fresh fruits and vegetables, whole grains, and beans. Activity  Exercise only as directed by your health care provider. Most women can continue their usual exercise routine during pregnancy. Try to exercise for 30 minutes at least 5 days a week. Stop exercising if you experience uterine contractions.  Avoid heavy lifting, wear low heel shoes, and practice good posture.  A sexual relationship may be continued unless your health care provider directs you  otherwise. Relieving pain and discomfort  Wear a good support bra to prevent discomfort from breast tenderness.  Take warm sitz baths to soothe any pain or discomfort caused by hemorrhoids. Use hemorrhoid cream if your health care provider approves.  Rest with your legs elevated if you have leg cramps or low back pain.  If you develop varicose veins, wear support hose. Elevate your feet for 15 minutes, 3-4 times a day. Limit salt in your diet. Prenatal Care  Write down your questions. Take them to your prenatal visits.  Keep all your prenatal visits as told by your health care provider. This is important. Safety  Wear your seat belt at all times when driving.  Make a list of emergency phone numbers, including numbers for family, friends, the hospital, and police and fire departments. General instructions  Ask your health care provider for a referral to a local prenatal education class. Begin classes no later than the beginning of month 6 of your pregnancy.  Ask for help if you have counseling or nutritional needs during pregnancy. Your health care provider can offer advice or refer you to specialists for help with various needs.  Do not use hot tubs, steam rooms, or saunas.  Do not douche or use tampons or scented sanitary pads.  Do not cross your legs for long periods of time.  Avoid cat litter boxes and soil used by cats. These carry germs that can cause birth defects in the baby and possibly loss of the fetus by miscarriage or stillbirth.  Avoid all smoking, herbs, alcohol, and unprescribed drugs. Chemicals in these products can affect the formation and growth of   the baby.  Do not use any products that contain nicotine or tobacco, such as cigarettes and e-cigarettes. If you need help quitting, ask your health care provider.  Visit your dentist if you have not gone yet during your pregnancy. Use a soft toothbrush to brush your teeth and be gentle when you floss. Contact a  health care provider if:  You have dizziness.  You have mild pelvic cramps, pelvic pressure, or nagging pain in the abdominal area.  You have persistent nausea, vomiting, or diarrhea.  You have a bad smelling vaginal discharge.  You have pain when you urinate. Get help right away if:  You have a fever.  You are leaking fluid from your vagina.  You have spotting or bleeding from your vagina.  You have severe abdominal cramping or pain.  You have rapid weight gain or weight loss.  You have shortness of breath with chest pain.  You notice sudden or extreme swelling of your face, hands, ankles, feet, or legs.  You have not felt your baby move in over an hour.  You have severe headaches that do not go away when you take medicine.  You have vision changes. Summary  The second trimester is from week 14 through week 27 (months 4 through 6). It is also a time when the fetus is growing rapidly.  Your body goes through many changes during pregnancy. The changes vary from woman to woman.  Avoid all smoking, herbs, alcohol, and unprescribed drugs. These chemicals affect the formation and growth your baby.  Do not use any tobacco products, such as cigarettes, chewing tobacco, and e-cigarettes. If you need help quitting, ask your health care provider.  Contact your health care provider if you have any questions. Keep all prenatal visits as told by your health care provider. This is important. This information is not intended to replace advice given to you by your health care provider. Make sure you discuss any questions you have with your health care provider. Document Revised: 07/08/2018 Document Reviewed: 04/21/2016 [employer] Patient Education  2020 [employer]   Contraception Choices Contraception, also called birth control, refers to methods or devices that prevent pregnancy. Hormonal methods Contraceptive implant  A contraceptive implant is a thin, plastic tube that  contains a hormone. It is inserted into the upper part of the arm. It can remain in place for up to 3 years. Progestin-only injections Progestin-only injections are injections of progestin, a synthetic form of the hormone progesterone. They are given every 3 months by a health care provider. Birth control pills  Birth control pills are pills that contain hormones that prevent pregnancy. They must be taken once a day, preferably at the same time each day. Birth control patch  The birth control patch contains hormones that prevent pregnancy. It is placed on the skin and must be changed once a week for three weeks and removed on the fourth week. A prescription is needed to use this method of contraception. Vaginal ring  A vaginal ring contains hormones that prevent pregnancy. It is placed in the vagina for three weeks and removed on the fourth week. After that, the process is repeated with a new ring. A prescription is needed to use this method of contraception. Emergency contraceptive Emergency contraceptives prevent pregnancy after unprotected sex. They come in pill form and can be taken up to 5 days after sex. They work best the sooner they are taken after having sex. Most emergency contraceptives are available without a prescription. This   method should not be used as your only form of birth control. Barrier methods Female condom  A female condom is a thin sheath that is worn over the penis during sex. Condoms keep sperm from going inside a woman's body. They can be used with a spermicide to increase their effectiveness. They should be disposed after a single use. Female condom  A female condom is a soft, loose-fitting sheath that is put into the vagina before sex. The condom keeps sperm from going inside a woman's body. They should be disposed after a single use. Diaphragm  A diaphragm is a soft, dome-shaped barrier. It is inserted into the vagina before sex, along with a spermicide. The  diaphragm blocks sperm from entering the uterus, and the spermicide kills sperm. A diaphragm should be left in the vagina for 6-8 hours after sex and removed within 24 hours. A diaphragm is prescribed and fitted by a health care provider. A diaphragm should be replaced every 1-2 years, after giving birth, after gaining more than 15 lb (6.8 kg), and after pelvic surgery. Cervical cap  A cervical cap is a round, soft latex or plastic cup that fits over the cervix. It is inserted into the vagina before sex, along with spermicide. It blocks sperm from entering the uterus. The cap should be left in place for 6-8 hours after sex and removed within 48 hours. A cervical cap must be prescribed and fitted by a health care provider. It should be replaced every 2 years. Sponge  A sponge is a soft, circular piece of polyurethane foam with spermicide on it. The sponge helps block sperm from entering the uterus, and the spermicide kills sperm. To use it, you make it wet and then insert it into the vagina. It should be inserted before sex, left in for at least 6 hours after sex, and removed and thrown away within 30 hours. Spermicides Spermicides are chemicals that kill or block sperm from entering the cervix and uterus. They can come as a cream, jelly, suppository, foam, or tablet. A spermicide should be inserted into the vagina with an applicator at least 10-15 minutes before sex to allow time for it to work. The process must be repeated every time you have sex. Spermicides do not require a prescription. Intrauterine contraception Intrauterine device (IUD) An IUD is a T-shaped device that is put in a woman's uterus. There are two types:  Hormone IUD.This type contains progestin, a synthetic form of the hormone progesterone. This type can stay in place for 3-5 years.  Copper IUD.This type is wrapped in copper wire. It can stay in place for 10 years.  Permanent methods of contraception Female tubal ligation In  this method, a woman's fallopian tubes are sealed, tied, or blocked during surgery to prevent eggs from traveling to the uterus. Hysteroscopic sterilization In this method, a small, flexible insert is placed into each fallopian tube. The inserts cause scar tissue to form in the fallopian tubes and block them, so sperm cannot reach an egg. The procedure takes about 3 months to be effective. Another form of birth control must be used during those 3 months. Female sterilization This is a procedure to tie off the tubes that carry sperm (vasectomy). After the procedure, the man can still ejaculate fluid (semen). Natural planning methods Natural family planning In this method, a couple does not have sex on days when the woman could become pregnant. Calendar method This means keeping track of the length of each menstrual cycle,   identifying the days when pregnancy can happen, and not having sex on those days. Ovulation method In this method, a couple avoids sex during ovulation. Symptothermal method This method involves not having sex during ovulation. The woman typically checks for ovulation by watching changes in her temperature and in the consistency of cervical mucus. Post-ovulation method In this method, a couple waits to have sex until after ovulation. Summary  Contraception, also called birth control, means methods or devices that prevent pregnancy.  Hormonal methods of contraception include implants, injections, pills, patches, vaginal rings, and emergency contraceptives.  Barrier methods of contraception can include female condoms, female condoms, diaphragms, cervical caps, sponges, and spermicides.  There are two types of IUDs (intrauterine devices). An IUD can be put in a woman's uterus to prevent pregnancy for 3-5 years.  Permanent sterilization can be done through a procedure for males, females, or both.  Natural family planning methods involve not having sex on days when the woman could  become pregnant. This information is not intended to replace advice given to you by your health care provider. Make sure you discuss any questions you have with your health care provider. Document Revised: 03/18/2017 Document Reviewed: 04/18/2016 Elsevier Patient Education  2020 Elsevier Inc.   Breastfeeding  Choosing to breastfeed is one of the best decisions you can make for yourself and your baby. A change in hormones during pregnancy causes your breasts to make breast milk in your milk-producing glands. Hormones prevent breast milk from being released before your baby is born. They also prompt milk flow after birth. Once breastfeeding has begun, thoughts of your baby, as well as his or her sucking or crying, can stimulate the release of milk from your milk-producing glands. Benefits of breastfeeding Research shows that breastfeeding offers many health benefits for infants and mothers. It also offers a cost-free and convenient way to feed your baby. For your baby  Your first milk (colostrum) helps your baby's digestive system to function better.  Special cells in your milk (antibodies) help your baby to fight off infections.  Breastfed babies are less likely to develop asthma, allergies, obesity, or type 2 diabetes. They are also at lower risk for sudden infant death syndrome (SIDS).  Nutrients in breast milk are better able to meet your baby's needs compared to infant formula.  Breast milk improves your baby's brain development. For you  Breastfeeding helps to create a very special bond between you and your baby.  Breastfeeding is convenient. Breast milk costs nothing and is always available at the correct temperature.  Breastfeeding helps to burn calories. It helps you to lose the weight that you gained during pregnancy.  Breastfeeding makes your uterus return faster to its size before pregnancy. It also slows bleeding (lochia) after you give birth.  Breastfeeding helps to lower  your risk of developing type 2 diabetes, osteoporosis, rheumatoid arthritis, cardiovascular disease, and breast, ovarian, uterine, and endometrial cancer later in life. Breastfeeding basics Starting breastfeeding  Find a comfortable place to sit or lie down, with your neck and back well-supported.  Place a pillow or a rolled-up blanket under your baby to bring him or her to the level of your breast (if you are seated). Nursing pillows are specially designed to help support your arms and your baby while you breastfeed.  Make sure that your baby's tummy (abdomen) is facing your abdomen.  Gently massage your breast. With your fingertips, massage from the outer edges of your breast inward toward the nipple. This encourages   milk flow. If your milk flows slowly, you may need to continue this action during the feeding.  Support your breast with 4 fingers underneath and your thumb above your nipple (make the letter "C" with your hand). Make sure your fingers are well away from your nipple and your baby's mouth.  Stroke your baby's lips gently with your finger or nipple.  When your baby's mouth is open wide enough, quickly bring your baby to your breast, placing your entire nipple and as much of the areola as possible into your baby's mouth. The areola is the colored area around your nipple. ? More areola should be visible above your baby's upper lip than below the lower lip. ? Your baby's lips should be opened and extended outward (flanged) to ensure an adequate, comfortable latch. ? Your baby's tongue should be between his or her lower gum and your breast.  Make sure that your baby's mouth is correctly positioned around your nipple (latched). Your baby's lips should create a seal on your breast and be turned out (everted).  It is common for your baby to suck about 2-3 minutes in order to start the flow of breast milk. Latching Teaching your baby how to latch onto your breast properly is very  important. An improper latch can cause nipple pain, decreased milk supply, and poor weight gain in your baby. Also, if your baby is not latched onto your nipple properly, he or she may swallow some air during feeding. This can make your baby fussy. Burping your baby when you switch breasts during the feeding can help to get rid of the air. However, teaching your baby to latch on properly is still the best way to prevent fussiness from swallowing air while breastfeeding. Signs that your baby has successfully latched onto your nipple  Silent tugging or silent sucking, without causing you pain. Infant's lips should be extended outward (flanged).  Swallowing heard between every 3-4 sucks once your milk has started to flow (after your let-down milk reflex occurs).  Muscle movement above and in front of his or her ears while sucking. Signs that your baby has not successfully latched onto your nipple  Sucking sounds or smacking sounds from your baby while breastfeeding.  Nipple pain. If you think your baby has not latched on correctly, slip your finger into the corner of your baby's mouth to break the suction and place it between your baby's gums. Attempt to start breastfeeding again. Signs of successful breastfeeding Signs from your baby  Your baby will gradually decrease the number of sucks or will completely stop sucking.  Your baby will fall asleep.  Your baby's body will relax.  Your baby will retain a small amount of milk in his or her mouth.  Your baby will let go of your breast by himself or herself. Signs from you  Breasts that have increased in firmness, weight, and size 1-3 hours after feeding.  Breasts that are softer immediately after breastfeeding.  Increased milk volume, as well as a change in milk consistency and color by the fifth day of breastfeeding.  Nipples that are not sore, cracked, or bleeding. Signs that your baby is getting enough milk  Wetting at least 1-2  diapers during the first 24 hours after birth.  Wetting at least 5-6 diapers every 24 hours for the first week after birth. The urine should be clear or pale yellow by the age of 5 days.  Wetting 6-8 diapers every 24 hours as your baby continues   to grow and develop.  At least 3 stools in a 24-hour period by the age of 5 days. The stool should be soft and yellow.  At least 3 stools in a 24-hour period by the age of 7 days. The stool should be seedy and yellow.  No loss of weight greater than 10% of birth weight during the first 3 days of life.  Average weight gain of 4-7 oz (113-198 g) per week after the age of 4 days.  Consistent daily weight gain by the age of 5 days, without weight loss after the age of 2 weeks. After a feeding, your baby may spit up a small amount of milk. This is normal. Breastfeeding frequency and duration Frequent feeding will help you make more milk and can prevent sore nipples and extremely full breasts (breast engorgement). Breastfeed when you feel the need to reduce the fullness of your breasts or when your baby shows signs of hunger. This is called "breastfeeding on demand." Signs that your baby is hungry include:  Increased alertness, activity, or restlessness.  Movement of the head from side to side.  Opening of the mouth when the corner of the mouth or cheek is stroked (rooting).  Increased sucking sounds, smacking lips, cooing, sighing, or squeaking.  Hand-to-mouth movements and sucking on fingers or hands.  Fussing or crying. Avoid introducing a pacifier to your baby in the first 4-6 weeks after your baby is born. After this time, you may choose to use a pacifier. Research has shown that pacifier use during the first year of a baby's life decreases the risk of sudden infant death syndrome (SIDS). Allow your baby to feed on each breast as long as he or she wants. When your baby unlatches or falls asleep while feeding from the first breast, offer the  second breast. Because newborns are often sleepy in the first few weeks of life, you may need to awaken your baby to get him or her to feed. Breastfeeding times will vary from baby to baby. However, the following rules can serve as a guide to help you make sure that your baby is properly fed:  Newborns (babies 4 weeks of age or younger) may breastfeed every 1-3 hours.  Newborns should not go without breastfeeding for longer than 3 hours during the day or 5 hours during the night.  You should breastfeed your baby a minimum of 8 times in a 24-hour period. Breast milk pumping     Pumping and storing breast milk allows you to make sure that your baby is exclusively fed your breast milk, even at times when you are unable to breastfeed. This is especially important if you go back to work while you are still breastfeeding, or if you are not able to be present during feedings. Your lactation consultant can help you find a method of pumping that works best for you and give you guidelines about how long it is safe to store breast milk. Caring for your breasts while you breastfeed Nipples can become dry, cracked, and sore while breastfeeding. The following recommendations can help keep your breasts moisturized and healthy:  Avoid using soap on your nipples.  Wear a supportive bra designed especially for nursing. Avoid wearing underwire-style bras or extremely tight bras (sports bras).  Air-dry your nipples for 3-4 minutes after each feeding.  Use only cotton bra pads to absorb leaked breast milk. Leaking of breast milk between feedings is normal.  Use lanolin on your nipples after breastfeeding. Lanolin helps   to maintain your skin's normal moisture barrier. Pure lanolin is not harmful (not toxic) to your baby. You may also hand express a few drops of breast milk and gently massage that milk into your nipples and allow the milk to air-dry. In the first few weeks after giving birth, some women experience  breast engorgement. Engorgement can make your breasts feel heavy, warm, and tender to the touch. Engorgement peaks within 3-5 days after you give birth. The following recommendations can help to ease engorgement:  Completely empty your breasts while breastfeeding or pumping. You may want to start by applying warm, moist heat (in the shower or with warm, water-soaked hand towels) just before feeding or pumping. This increases circulation and helps the milk flow. If your baby does not completely empty your breasts while breastfeeding, pump any extra milk after he or she is finished.  Apply ice packs to your breasts immediately after breastfeeding or pumping, unless this is too uncomfortable for you. To do this: ? Put ice in a plastic bag. ? Place a towel between your skin and the bag. ? Leave the ice on for 20 minutes, 2-3 times a day.  Make sure that your baby is latched on and positioned properly while breastfeeding. If engorgement persists after 48 hours of following these recommendations, contact your health care provider or a lactation consultant. Overall health care recommendations while breastfeeding  Eat 3 healthy meals and 3 snacks every day. Well-nourished mothers who are breastfeeding need an additional 450-500 calories a day. You can meet this requirement by increasing the amount of a balanced diet that you eat.  Drink enough water to keep your urine pale yellow or clear.  Rest often, relax, and continue to take your prenatal vitamins to prevent fatigue, stress, and low vitamin and mineral levels in your body (nutrient deficiencies).  Do not use any products that contain nicotine or tobacco, such as cigarettes and e-cigarettes. Your baby may be harmed by chemicals from cigarettes that pass into breast milk and exposure to secondhand smoke. If you need help quitting, ask your health care provider.  Avoid alcohol.  Do not use illegal drugs or marijuana.  Talk with your health care  provider before taking any medicines. These include over-the-counter and prescription medicines as well as vitamins and herbal supplements. Some medicines that may be harmful to your baby can pass through breast milk.  It is possible to become pregnant while breastfeeding. If birth control is desired, ask your health care provider about options that will be safe while breastfeeding your baby. Where to find more information: [employer]: www.llli.org Contact a health care provider if:  You feel like you want to stop breastfeeding or have become frustrated with breastfeeding.  Your nipples are cracked or bleeding.  Your breasts are red, tender, or warm.  You have: ? Painful breasts or nipples. ? A swollen area on either breast. ? A fever or chills. ? Nausea or vomiting. ? Drainage other than breast milk from your nipples.  Your breasts do not become full before feedings by the fifth day after you give birth.  You feel sad and depressed.  Your baby is: ? Too sleepy to eat well. ? Having trouble sleeping. ? More than 1 week old and wetting fewer than 6 diapers in a 24-hour period. ? Not gaining weight by 5 days of age.  Your baby has fewer than 3 stools in a 24-hour period.  Your baby's skin or the white parts of   his or her eyes become yellow. Get help right away if:  Your baby is overly tired (lethargic) and does not want to wake up and feed.  Your baby develops an unexplained fever. Summary  Breastfeeding offers many health benefits for infant and mothers.  Try to breastfeed your infant when he or she shows early signs of hunger.  Gently tickle or stroke your baby's lips with your finger or nipple to allow the baby to open his or her mouth. Bring the baby to your breast. Make sure that much of the areola is in your baby's mouth. Offer one side and burp the baby before you offer the other side.  Talk with your health care provider or lactation consultant  if you have questions or you face problems as you breastfeed. This information is not intended to replace advice given to you by your health care provider. Make sure you discuss any questions you have with your health care provider. Document Revised: 06/10/2017 Document Reviewed: 04/17/2016 Elsevier Patient Education  2020 Elsevier Inc.  

## 2019-04-24 NOTE — Progress Notes (Signed)
Subjective:    Teresa Sanchez is a [id number] [redacted]w[redacted]d being seen today for her first obstetrical visit.  Her obstetrical history is significant for advanced maternal age, obesity, smoker and previous cesarean section. Patient does intend to breast feed. Pregnancy history fully reviewed.  Patient reports no complaints.  Vitals:   04/24/19 1107  BP: 124/76  Pulse: 92  Weight: 290 lb (131.5 kg)    HISTORY: OB History  Gravida Para Term Preterm AB Living  3 1 1   1 1   SAB TAB Ectopic Multiple Live Births    1     1    # Outcome Date GA Lbr Len/2nd Weight Sex Delivery Anes PTL Lv  3 Current           2 Term 12/16/10 [redacted]w[redacted]d   F CS-LTranv Spinal  LIV     Complications: Failure to Progress in Second Stage  1 TAB            History reviewed. No pertinent past medical history. Past Surgical History:  Procedure Laterality Date  . CESAREAN SECTION    . LEEP  2018   Family History  Problem Relation Age of Onset  . Stroke Mother   . Diabetes Mother   . COPD Mother   . Hypertension Father   . Prostate cancer Father   . Stroke Maternal Grandmother   . Diabetes Maternal Grandfather      Exam    Uterus:     Pelvic Exam:    Perineum: No Hemorrhoids   Vulva: normal   Vagina:  normal mucosa, normal discharge   pH:    Cervix: multiparous appearance and cervix is closed and long   Adnexa: not evaluated   Bony Pelvis: gynecoid  System: Breast:  normal appearance, no masses or tenderness   Skin: normal coloration and turgor, no rashes    Neurologic: oriented, no focal deficits   Extremities: normal strength, tone, and muscle mass   HEENT extra ocular movement intact   Mouth/Teeth mucous membranes moist, pharynx normal without lesions and dental hygiene good   Neck supple and no masses   Cardiovascular: regular rate and rhythm   Respiratory:  appears well, vitals normal, no respiratory distress, acyanotic, normal RR, chest clear, no wheezing, crepitations, rhonchi, normal  symmetric air entry   Abdomen: soft, non-tender; bowel sounds normal; no masses,  no organomegaly   Urinary:    Pt informed that the ultrasound is considered a limited OB ultrasound and is not intended to be a complete ultrasound exam.  Patient also informed that the ultrasound is not being completed with the intent of assessing for fetal or placental anomalies or any pelvic abnormalities.  Explained that the purpose of today's ultrasound is to assess for  viability.  IUP visualized but no clear fetal cardiac activity visualized due to body habitus. Patient acknowledges the purpose of the exam and the limitations of the study.       Assessment:    Pregnancy: G3P1011 Patient Active Problem List   Diagnosis Date Noted  . Maternal obesity affecting pregnancy, antepartum 04/24/2019  . Previous cesarean delivery affecting pregnancy, antepartum 04/24/2019  . Encounter for supervision of high risk multigravida of advanced maternal age, antepartum 04/06/2019  . Smoking (tobacco) complicating pregnancy, first trimester 04/06/2019  . CIN II (cervical intraepithelial neoplasia II) 01/15/2017  . ASCUS with positive high risk HPV cervical 12/03/2016        Plan:     Initial labs drawn. Prenatal vitamins.  Problem list reviewed and updated. Genetic Screening discussed : panorama deferred to next visit due to elevated BMI.  Ultrasound discussed; fetal survey: ordered. Viability scan ordered Patient with previous c-section- information on TOLAC provided for review. This will be discussed later Rx ASA provided to start at 12 weeks  Follow up in 4 weeks. 50% of 30 min visit spent on counseling and coordination of care.     Maynor Mwangi 04/24/2019

## 2019-04-25 LAB — CYTOLOGY - PAP
Comment: NEGATIVE
Diagnosis: NEGATIVE
High risk HPV: NEGATIVE

## 2019-04-25 LAB — CERVICOVAGINAL ANCILLARY ONLY
Bacterial Vaginitis (gardnerella): POSITIVE — AB
Candida Glabrata: NEGATIVE
Candida Vaginitis: NEGATIVE
Chlamydia: NEGATIVE
Comment: NEGATIVE
Comment: NEGATIVE
Comment: NEGATIVE
Comment: NEGATIVE
Comment: NEGATIVE
Comment: NORMAL
Neisseria Gonorrhea: NEGATIVE
Trichomonas: NEGATIVE

## 2019-04-25 LAB — OBSTETRIC PANEL, INCLUDING HIV
Antibody Screen: NEGATIVE
Basophils Absolute: 0 10*3/uL (ref 0.0–0.2)
Basos: 0 %
EOS (ABSOLUTE): 0.1 10*3/uL (ref 0.0–0.4)
Eos: 1 %
HIV Screen 4th Generation wRfx: NONREACTIVE
Hematocrit: 33.9 % — ABNORMAL LOW (ref 34.0–46.6)
Hemoglobin: 11.4 g/dL (ref 11.1–15.9)
Hepatitis B Surface Ag: NEGATIVE
Immature Grans (Abs): 0 10*3/uL (ref 0.0–0.1)
Immature Granulocytes: 0 %
Lymphocytes Absolute: 1.9 10*3/uL (ref 0.7–3.1)
Lymphs: 21 %
MCH: 31 pg (ref 26.6–33.0)
MCHC: 33.6 g/dL (ref 31.5–35.7)
MCV: 92 fL (ref 79–97)
Monocytes Absolute: 0.6 10*3/uL (ref 0.1–0.9)
Monocytes: 7 %
Neutrophils Absolute: 6.5 10*3/uL (ref 1.4–7.0)
Neutrophils: 71 %
Platelets: 344 10*3/uL (ref 150–450)
RBC: 3.68 x10E6/uL — ABNORMAL LOW (ref 3.77–5.28)
RDW: 11.6 % — ABNORMAL LOW (ref 11.7–15.4)
RPR Ser Ql: NONREACTIVE
Rh Factor: POSITIVE
Rubella Antibodies, IGG: 4.9 index (ref >0.99)
WBC: 9.1 10*3/uL (ref 3.4–10.8)

## 2019-04-26 LAB — URINE CULTURE, OB REFLEX

## 2019-04-26 LAB — CULTURE, OB URINE

## 2019-04-26 MED ORDER — METRONIDAZOLE 500 MG PO TABS: 500.0000 mg | ORAL_TABLET | Freq: Two times a day (BID) | ORAL | 0 refills | Status: DC

## 2019-04-26 NOTE — Addendum Note (Signed)
Addended by: Catalina Antigua on: 04/26/2019 08:40 AM   Modules accepted: Orders

## 2019-05-01 ENCOUNTER — Other Ambulatory Visit: Payer: Self-pay

## 2019-05-01 ENCOUNTER — Ambulatory Visit (INDEPENDENT_AMBULATORY_CARE_PROVIDER_SITE_OTHER): Payer: Medicaid Other | Admitting: General Practice

## 2019-05-01 ENCOUNTER — Ambulatory Visit (HOSPITAL_COMMUNITY)
Admission: RE | Admit: 2019-05-01 | Discharge: 2019-05-01 | Disposition: A | Discharge status: Home / Self Care | Payer: Medicaid Other | Source: Ambulatory Visit | Attending: Obstetrics and Gynecology | Admitting: Obstetrics and Gynecology

## 2019-05-01 DIAGNOSIS — O09529 Supervision of elderly multigravida, unspecified trimester: Secondary | ICD-10-CM | POA: Diagnosis not present

## 2019-05-01 DIAGNOSIS — Z3A10 10 weeks gestation of pregnancy: Secondary | ICD-10-CM | POA: Diagnosis not present

## 2019-05-01 DIAGNOSIS — O09521 Supervision of elderly multigravida, first trimester: Secondary | ICD-10-CM | POA: Diagnosis not present

## 2019-05-01 DIAGNOSIS — Z712 Person consulting for explanation of examination or test findings: Secondary | ICD-10-CM

## 2019-05-01 NOTE — Progress Notes (Signed)
Patient presents to office today for viability/dating ultrasound results. Reviewed results with Dr Adrian Blackwater who finds single living IUP at [redacted]w[redacted]d- patient can continue with prenatal care as previously scheduled.   Informed patient of results, reviewed dating, & provided pictures. Patient verbalized understanding and will follow up on 2/22 for OB visit.  Chase Caller RN BSN 05/01/19

## 2019-05-01 NOTE — Progress Notes (Signed)
Chart reviewed - agree with CMA/RN documentation.  ° °

## 2019-05-06 ENCOUNTER — Encounter: Payer: Self-pay | Admitting: Obstetrics and Gynecology

## 2019-05-06 DIAGNOSIS — O3440 Maternal care for other abnormalities of cervix, unspecified trimester: Secondary | ICD-10-CM | POA: Insufficient documentation

## 2019-05-06 DIAGNOSIS — O09529 Supervision of elderly multigravida, unspecified trimester: Secondary | ICD-10-CM | POA: Insufficient documentation

## 2019-05-22 ENCOUNTER — Encounter: Payer: Self-pay | Admitting: Nurse Practitioner

## 2019-05-22 ENCOUNTER — Other Ambulatory Visit: Payer: Self-pay

## 2019-05-22 ENCOUNTER — Ambulatory Visit (INDEPENDENT_AMBULATORY_CARE_PROVIDER_SITE_OTHER): Payer: Medicaid Other | Admitting: Nurse Practitioner

## 2019-05-22 VITALS — BP 125/81 | HR 72 | Wt 289.0 lb

## 2019-05-22 DIAGNOSIS — O99331 Smoking (tobacco) complicating pregnancy, first trimester: Secondary | ICD-10-CM

## 2019-05-22 DIAGNOSIS — O09529 Supervision of elderly multigravida, unspecified trimester: Secondary | ICD-10-CM

## 2019-05-22 DIAGNOSIS — O219 Vomiting of pregnancy, unspecified: Secondary | ICD-10-CM

## 2019-05-22 DIAGNOSIS — Z3A19 19 weeks gestation of pregnancy: Secondary | ICD-10-CM

## 2019-05-22 DIAGNOSIS — O99332 Smoking (tobacco) complicating pregnancy, second trimester: Secondary | ICD-10-CM

## 2019-05-22 DIAGNOSIS — O09522 Supervision of elderly multigravida, second trimester: Secondary | ICD-10-CM

## 2019-05-22 DIAGNOSIS — Z6841 Body Mass Index (BMI) 40.0 and over, adult: Secondary | ICD-10-CM

## 2019-05-22 MED ORDER — DOXYLAMINE-PYRIDOXINE 10-10 MG PO TBEC: DELAYED_RELEASE_TABLET | ORAL | 2 refills | Status: DC

## 2019-05-22 NOTE — Progress Notes (Signed)
ROB  CC: still dealing with morning sickness pt notes it has got better  Pt consents to student in exam room   Chart reviewed.  Agree with plan of care.   Currie Paris, NP 05/22/2019 12:51 PM

## 2019-05-22 NOTE — Progress Notes (Signed)
    Subjective:  Teresa Sanchez is a 39 y.o. G3P1011 at [redacted]w[redacted]d being seen today for ongoing prenatal care.  She is currently monitored for the following issues for this low-risk pregnancy and has ASCUS with positive high risk HPV cervical; CIN II (cervical intraepithelial neoplasia II); Encounter for supervision of high risk multigravida of advanced maternal age, antepartum; Smoking (tobacco) complicating pregnancy, first trimester; Maternal obesity affecting pregnancy, antepartum; Previous cesarean delivery affecting pregnancy, antepartum; AMA (advanced maternal age) multigravida 35+; and History of LEEP (loop electrosurgical excision procedure) of cervix complicating pregnancy on their problem list.  Patient reports nausea.  Contractions: Not present. Vag. Bleeding: None.  Movement: Present. Denies leaking of fluid.   The following portions of the patient's history were reviewed and updated as appropriate: allergies, current medications, past family history, past medical history, past social history, past surgical history and problem list. Problem list updated.  Objective:   Vitals:   05/22/19 0928  BP: 125/81  Pulse: 72  Weight: 289 lb (131.1 kg)    Fetal Status: Fetal Heart Rate (bpm): 152   Movement: Present     General:  Alert, oriented and cooperative. Patient is in no acute distress.  Skin: Skin is warm and dry. No rash noted.   Cardiovascular: Normal heart rate noted  Respiratory: Normal respiratory effort, no problems with respiration noted  Abdomen: Soft, gravid, appropriate for gestational age. Pain/Pressure: Present     Pelvic:  Cervical exam deferred        Extremities: Normal range of motion.  Edema: None  Mental Status: Normal mood and affect. Normal behavior. Normal judgment and thought content.   Urinalysis:      Assessment and Plan:  Pregnancy: G3P1011 at [redacted]w[redacted]d  1. Encounter for supervision of high risk multigravida of advanced maternal age, antepartum Discussed  AFP but not time in the pregnancy to draw today.  - Genetic Screening  2. BMI 40.0-44.9, adult (HCC)   3. Nausea and vomiting in pregnancy prior to [redacted] weeks gestation Still a problem and phenergan is not working well.  Prescribed Diclesgis and instructions given on how to take Diclegis.  4.  Smoking Smokes 2 cig a day and is planning to stop smoking and expects not to start again when the baby is born.  Her partner still smokes so discussed him calling 1-800-Quit Now and the risks to the baby of more colds, ear infections, pneumonia and asthma if parents are smokers.  Incentive to quit in this pregnancy.  5.  Considering BTL Does not want more children Will plan for BTL and will need to sign papers with MD visit when VBAC consent is signed - anticipate at 28 weeks.  Preterm labor symptoms and general obstetric precautions including but not limited to vaginal bleeding, contractions, leaking of fluid and fetal movement were reviewed in detail with the patient. Please refer to After Visit Summary for other counseling recommendations.  Return in about 3 weeks (around 06/12/2019) for Lab Only visit for AFP in 3 weeks and in 4 weeks Virtual ROB.  Nolene Bernheim, RN, MSN, NP-BC Nurse Practitioner, [facility] for [employer], [facility] 05/22/2019 12:24 PM

## 2019-05-22 NOTE — Patient Instructions (Signed)
1-800-QUIT NOW to stop smoking.

## 2019-05-30 ENCOUNTER — Encounter: Payer: Self-pay | Admitting: Nurse Practitioner

## 2019-06-01 ENCOUNTER — Encounter: Payer: Self-pay | Admitting: Nurse Practitioner

## 2019-06-12 ENCOUNTER — Other Ambulatory Visit: Payer: Medicaid Other

## 2019-06-12 ENCOUNTER — Other Ambulatory Visit: Payer: Self-pay

## 2019-06-12 DIAGNOSIS — O09529 Supervision of elderly multigravida, unspecified trimester: Secondary | ICD-10-CM | POA: Diagnosis not present

## 2019-06-14 LAB — HEMOGLOBIN A1C
Est. average glucose Bld gHb Est-mCnc: 94 mg/dL
Hgb A1c MFr Bld: 4.9 % (ref 4.8–5.6)

## 2019-06-14 LAB — AFP, SERUM, OPEN SPINA BIFIDA
AFP MoM: 1.11
AFP Value: 30.5 ng/mL
Gest. Age on Collection Date: 16.7 weeks
Maternal Age At EDD: 38.7 yr
OSBR Risk 1 IN: 10000
Test Results:: NEGATIVE
Weight: 289 [lb_av]

## 2019-06-19 ENCOUNTER — Telehealth (INDEPENDENT_AMBULATORY_CARE_PROVIDER_SITE_OTHER): Payer: Medicaid Other | Admitting: Advanced Practice Midwife

## 2019-06-19 VITALS — BP 132/78 | HR 88

## 2019-06-19 DIAGNOSIS — R198 Other specified symptoms and signs involving the digestive system and abdomen: Secondary | ICD-10-CM

## 2019-06-19 DIAGNOSIS — O09529 Supervision of elderly multigravida, unspecified trimester: Secondary | ICD-10-CM

## 2019-06-19 DIAGNOSIS — O99210 Obesity complicating pregnancy, unspecified trimester: Secondary | ICD-10-CM

## 2019-06-19 DIAGNOSIS — O219 Vomiting of pregnancy, unspecified: Secondary | ICD-10-CM

## 2019-06-19 DIAGNOSIS — Z3A17 17 weeks gestation of pregnancy: Secondary | ICD-10-CM

## 2019-06-19 DIAGNOSIS — O34219 Maternal care for unspecified type scar from previous cesarean delivery: Secondary | ICD-10-CM

## 2019-06-19 MED ORDER — DOXYLAMINE-PYRIDOXINE 10-10 MG PO TBEC: DELAYED_RELEASE_TABLET | ORAL | 5 refills | Status: DC

## 2019-06-19 NOTE — Progress Notes (Signed)
S/w pt for virtual visit, pt reports feeling some movement, denies pain today.

## 2019-06-19 NOTE — Progress Notes (Signed)
   TELEHEALTH VIRTUAL OBSTETRICS VISIT ENCOUNTER NOTE  I connected with Teresa Sanchez on 06/19/19 at  8:35 AM EDT by MyChart virtual visit at home and verified that I am speaking with the correct person using two identifiers.   I discussed the limitations, risks, security and privacy concerns of performing an evaluation and management service by telephone and the availability of in person appointments. I also discussed with the patient that there may be a patient responsible charge related to this service. The patient expressed understanding and agreed to proceed.  Subjective:  Teresa Sanchez is a 39 y.o. G3P1011 at [redacted]w[redacted]d being followed for ongoing prenatal care.  She is currently monitored for the following issues for this high-risk pregnancy and has ASCUS with positive high risk HPV cervical; CIN II (cervical intraepithelial neoplasia II); Encounter for supervision of high risk multigravida of advanced maternal age, antepartum; Smoking (tobacco) complicating pregnancy, first trimester; Maternal obesity affecting pregnancy, antepartum; Previous cesarean delivery affecting pregnancy, antepartum; AMA (advanced maternal age) multigravida 35+; and History of LEEP (loop electrosurgical excision procedure) of cervix complicating pregnancy on their problem list.  Patient reports digestive issues, soft stools and bowel gas. Reports fetal movement. Denies any contractions, bleeding or leaking of fluid.   The following portions of the patient's history were reviewed and updated as appropriate: allergies, current medications, past family history, past medical history, past social history, past surgical history and problem list.   Objective:   General:  Alert, oriented and cooperative.   Mental Status: Normal mood and affect perceived. Normal judgment and thought content.  Rest of physical exam deferred due to type of encounter  Assessment and Plan:  Pregnancy: G3P1011 at [redacted]w[redacted]d  1. Maternal obesity  affecting pregnancy, antepartum   2. Previous cesarean delivery affecting pregnancy, antepartum   3. Encounter for supervision of high risk multigravida of advanced maternal age, antepartum --Anticipatory guidance about next visits/weeks of pregnancy given. --Reviewed normal genetic screening lab results with pt, including Panorama, Horizon, and AFP. --Next visit virtual in 4 weeks  4. Digestive symptoms --Increased bowel gas common in pregnancy, likely from slow transit, decreased circulation to digestive system.  Try yogurt/probiotic daily, can use OTC simethicone PRN.   --Seek care if pain, fever or chills, or n/v  5. Nausea and vomiting during pregnancy prior to [redacted] weeks gestation --Renewed pt Diclegis at her request for daily nausea  Preterm labor symptoms and general obstetric precautions including but not limited to vaginal bleeding, contractions, leaking of fluid and fetal movement were reviewed in detail with the patient.  I discussed the assessment and treatment plan with the patient. The patient was provided an opportunity to ask questions and all were answered. The patient agreed with the plan and demonstrated an understanding of the instructions. The patient was advised to call back or seek an in-person office evaluation/go to MAU at [facility] for any urgent or concerning symptoms. Please refer to After Visit Summary for other counseling recommendations.   I provided 10 minutes of face-to-face time during this encounter.  No follow-ups on file.  Future Appointments  Date Time Provider Department Center  06/22/2019  9:45 AM WH-MFC [place] 2 WH-MFCUS MFC-US    Sharen Counter, CNM Center for [employer], [facility]

## 2019-06-20 ENCOUNTER — Inpatient Hospital Stay (HOSPITAL_BASED_OUTPATIENT_CLINIC_OR_DEPARTMENT_OTHER): Payer: Medicaid Other

## 2019-06-20 ENCOUNTER — Other Ambulatory Visit: Payer: Self-pay

## 2019-06-20 ENCOUNTER — Inpatient Hospital Stay (HOSPITAL_COMMUNITY)
Admission: AD | Admit: 2019-06-20 | Discharge: 2019-06-20 | Disposition: A | Discharge status: Home / Self Care | Payer: Medicaid Other | Attending: Obstetrics and Gynecology | Admitting: Obstetrics and Gynecology

## 2019-06-20 ENCOUNTER — Encounter (HOSPITAL_COMMUNITY): Payer: Self-pay | Admitting: Obstetrics and Gynecology

## 2019-06-20 DIAGNOSIS — O4292 Full-term premature rupture of membranes, unspecified as to length of time between rupture and onset of labor: Secondary | ICD-10-CM | POA: Diagnosis not present

## 2019-06-20 DIAGNOSIS — N898 Other specified noninflammatory disorders of vagina: Secondary | ICD-10-CM | POA: Diagnosis present

## 2019-06-20 DIAGNOSIS — O99332 Smoking (tobacco) complicating pregnancy, second trimester: Secondary | ICD-10-CM

## 2019-06-20 DIAGNOSIS — O3442 Maternal care for other abnormalities of cervix, second trimester: Secondary | ICD-10-CM

## 2019-06-20 DIAGNOSIS — F17210 Nicotine dependence, cigarettes, uncomplicated: Secondary | ICD-10-CM | POA: Insufficient documentation

## 2019-06-20 DIAGNOSIS — Z3492 Encounter for supervision of normal pregnancy, unspecified, second trimester: Secondary | ICD-10-CM

## 2019-06-20 DIAGNOSIS — E669 Obesity, unspecified: Secondary | ICD-10-CM

## 2019-06-20 DIAGNOSIS — O34219 Maternal care for unspecified type scar from previous cesarean delivery: Secondary | ICD-10-CM | POA: Diagnosis not present

## 2019-06-20 DIAGNOSIS — O42912 Preterm premature rupture of membranes, unspecified as to length of time between rupture and onset of labor, second trimester: Secondary | ICD-10-CM | POA: Insufficient documentation

## 2019-06-20 DIAGNOSIS — O09522 Supervision of elderly multigravida, second trimester: Secondary | ICD-10-CM

## 2019-06-20 DIAGNOSIS — Z3A17 17 weeks gestation of pregnancy: Secondary | ICD-10-CM | POA: Insufficient documentation

## 2019-06-20 DIAGNOSIS — O42919 Preterm premature rupture of membranes, unspecified as to length of time between rupture and onset of labor, unspecified trimester: Secondary | ICD-10-CM

## 2019-06-20 DIAGNOSIS — O99212 Obesity complicating pregnancy, second trimester: Secondary | ICD-10-CM

## 2019-06-20 DIAGNOSIS — O99210 Obesity complicating pregnancy, unspecified trimester: Secondary | ICD-10-CM

## 2019-06-20 LAB — URINALYSIS, ROUTINE W REFLEX MICROSCOPIC
Bilirubin Urine: NEGATIVE
Glucose, UA: NEGATIVE mg/dL
Hgb urine dipstick: NEGATIVE
Ketones, ur: NEGATIVE mg/dL
Leukocytes,Ua: NEGATIVE
Nitrite: NEGATIVE
Protein, ur: NEGATIVE mg/dL
Specific Gravity, Urine: 1.016 (ref 1.005–1.030)
pH: 7 (ref 5.0–8.0)

## 2019-06-20 LAB — AMNISURE RUPTURE OF MEMBRANE (ROM) NOT AT ARMC: Amnisure ROM: POSITIVE

## 2019-06-20 LAB — WET PREP, GENITAL
Clue Cells Wet Prep HPF POC: NONE SEEN
Sperm: NONE SEEN
Trich, Wet Prep: NONE SEEN
Yeast Wet Prep HPF POC: NONE SEEN

## 2019-06-20 NOTE — MAU Note (Addendum)
Pt states that she's had vaginal discharge for a few days. Had a big gush yesterday, soaking her underwear.  Has yellow tinge. Today she has seen light discharge. Denies VB or abdominal pain.

## 2019-06-20 NOTE — Discharge Instructions (Signed)
Premature Rupture and Preterm Premature Rupture of Membranes  A sac made up of membranes surrounds your baby in the womb (uterus). Rupture of membranes is when this sac breaks open. This is also known as your "water breaking." When this sac breaks before labor starts, it is called premature rupture of membranes (PROM). If this happens before 37 weeks of being pregnant, it is called preterm premature rupture of membranes (PPROM). PPROM is serious. It needs medical care right away. What increases the risk of PPROM? PPROM is more likely to happen in women who:  Have an infection.  Have had PPROM before.  Have a cervix that is short.  Have bleeding during the second or third trimester.  Have a low BMI. This is a measure of body fat.  Smoke.  Use drugs.  Have a low socioeconomic status. What problems can be caused by PROM and PPROM? This condition creates health dangers for the mother and the baby. These include:  Giving birth to the baby too early (prematurely).  Getting a serious infection of the placenta (chorioamnionitis).  Having the placenta detach from the uterus early (placental abruption).  Squeezing of the umbilical cord.  Getting a serious infection after delivery. What are the signs of PROM and PPROM?  A sudden gush of fluid from the vagina.  A slow leak of fluid from the vagina.  Your underwear is wet. What should I do if I think my water broke? Call your doctor right away. You will need to go to the hospital to get checked right away. What happens if I am told that I have PROM or PPROM? You will have tests done at the hospital.  If you have PROM, you may be given medicine to start labor (be induced). This may be done if you are not having contractions during the 24 hours after your water broke.  If you have PPROM and are not having contractions, you may be given medicine to start labor. It will depend on how far along you are in your pregnancy. If you have  PPROM:  You and your baby will be watched closely to see if you have infections or other problems.  You may be given: ? An antibiotic medicine. This can stop an infection from starting. ? A steroid medicine. This can help your baby's lungs develop faster. ? A medicine to help prevent cerebral palsy in your baby. ? A medicine to stop early labor (preterm labor).  You may be told to stay in bed except to use the bathroom (bed rest).  You may be given medicine to start labor. This may be done if there are problems with you or the baby. Your treatment will depend on many factors. Contact a doctor if:  Your water breaks and you are not having contractions. Get help right away if:  Your water breaks before you are [redacted] weeks pregnant. Summary  When your water breaks before labor starts, it is called premature rupture of membranes (PROM).  When PROM happens before 37 weeks of pregnancy, it is called preterm premature rupture of membranes (PPROM).  If you are not having contractions, your labor may be started for you. This information is not intended to replace advice given to you by your health care provider. Make sure you discuss any questions you have with your health care provider. Document Revised: 07/08/2018 Document Reviewed: 12/05/2015 Elsevier Patient Education  2020 [employer]vier Inc[employer]  

## 2019-06-20 NOTE — MAU Provider Note (Signed)
History     CSN: [id number]  Arrival date and time: 06/20/19 1023   First Provider Initiated Contact with Patient 06/20/19 1109      Chief Complaint  Patient presents with  . Rupture of Membranes   HPI Teresa Sanchez is a 39 y.o. G3P1011 at 51w6dwho presents to MAU with chief complaint of abnormal vaginal discharge. This is a new problem, onset Saturday 03/20 and continuing intermittently since that time. Patient endorses a significantly larger volume of discharge yesterday and noted for the first time that it was yellow-tinged. She denies pain, bleeding, abdominal tenderness, fever or recent illness. Most recent sexual intercourse two weeks ago.  [place] [facility]  OB History    Gravida  3   Para  1   Term  1   Preterm      AB  1   Living  1     SAB      TAB  1   Ectopic      Multiple      Live Births  1           History reviewed. No pertinent past medical history.  Past Surgical History:  Procedure Laterality Date  . CESAREAN SECTION    . LEEP  2018    Family History  Problem Relation Age of Onset  . Stroke Mother   . Diabetes Mother   . COPD Mother   . Hypertension Father   . Prostate cancer Father   . Stroke Maternal Grandmother   . Diabetes Maternal Grandfather     Social History   Tobacco Use  . Smoking status: Current Every Day Smoker    Packs/day: 0.25    Types: Cigarettes  . Smokeless tobacco: Never Used  . Tobacco comment: 2 cigs/day  Substance Use Topics  . Alcohol use: Not Currently    Comment: occasionally   . Drug use: No    Allergies: No Known Allergies  Medications Prior to Admission  Medication Sig Dispense Refill Last Dose  . aspirin EC 81 MG tablet Take 1 tablet (81 mg total) by mouth daily. Take after 12 weeks for prevention of preeclampsia later in pregnancy 300 tablet 2 06/19/2019 at Unknown time  . Prenatal Vit w/Fe-Methylfol-FA (PNV PO) Take by mouth.   06/19/2019 at Unknown time  .  promethazine (PHENERGAN) 25 MG tablet Take 1 tablet (25 mg total) by mouth every 6 (six) hours as needed for nausea or vomiting. 30 tablet 2 06/19/2019 at Unknown time  . Blood Pressure Monitoring (BLOOD PRESSURE KIT) DEVI 1 Device by Does not apply route as needed. 1 each 0   . Doxylamine-Pyridoxine (DICLEGIS) 10-10 MG TBEC Take 2 tabs at bedtime. If needed, add another tab in the morning. If needed, add another tab in the afternoon, up to 4 tabs/day. 100 tablet 5  at not taking  . metroNIDAZOLE (FLAGYL) 500 MG tablet Take 1 tablet (500 mg total) by mouth 2 (two) times daily. (Patient not taking: Reported on 05/22/2019) 14 tablet 0   . naproxen (NAPROSYN) 500 MG tablet Take 1 tablet (500 mg total) by mouth 2 (two) times daily. (Patient not taking: Reported on 12/03/2016) 30 tablet 0     Review of Systems  Constitutional: Negative for fever.  Respiratory: Negative for cough and shortness of breath.   Gastrointestinal: Negative for abdominal pain.  Genitourinary: Positive for vaginal discharge. Negative for vaginal bleeding.  Musculoskeletal: Negative for back pain.  All other systems  reviewed and are negative.  Physical Exam   Blood pressure 121/74, pulse 94, temperature 98.7 F (37.1 C), temperature source Oral, resp. rate 17, height _0  (1.778 m), weight 130.4 kg, last menstrual period 02/09/2019, SpO2 100 %.  Physical Exam  Nursing note and vitals reviewed. Constitutional: She is oriented to person, place, and time. She appears well-developed and well-nourished.  Cardiovascular: Normal rate and normal heart sounds.  Respiratory: Effort normal and breath sounds normal.  GI: Soft. Bowel sounds are normal. She exhibits no distension. There is no abdominal tenderness. There is no rebound, no guarding and no CVA tenderness.  Genitourinary:    Vaginal discharge present.     Genitourinary Comments: Pelvic exam: External genitalia normal, vaginal walls pink and well rugated, cervix visually  closed, no lesions noted. + pooling of thick mucoid discharge proximal to cervix.    Neurological: She is alert and oriented to person, place, and time.  Skin: Skin is warm and dry.  Psychiatric: She has a normal mood and affect. Her behavior is normal. Judgment and thought content normal.    MAU Course  Procedures: sterile speculum exam, MFM [place]  -Options for subsequent plan of care confirmed with Dr. Elly Modena and Dr. Donalee Citrin --Dr. Donalee Citrin advises repeat MFM [place] in one week. Order placed -Diagnosis discussed with patient and her husband, appropriate support given to her.  Patient given options of expectant management or proceeding with delivery (IOL vs D&E); she is leaning towards expectant management for now.  Discussed risks of increased risks of infection (chorioamnionitis, endometritis), abruptio placentae, cord prolapse, fetal death, preterm birth, and need for cesarean delivery. Also emphasized that the neonate is at risk for morbidity/mortality related to preterm birth, infection, pulmonary hypoplasia, and musculoskeletal deformation.  She was told that if she makes it to 23.5 weeks, she will be admitted for inpatient observation, steroids and antibiotics; none of that is indicated for now.  She was told to call for any signs/symptoms of infection or bleeding; chorioamnionitis or abruption are indications for delivery. She was discharged to home; will follow up in clinic no later than Tuesday 03/30.   Patient Vitals for the past 24 hrs:  BP Temp Temp src Pulse Resp SpO2 Height Weight  06/20/19 1335 137/68 -- -- -- -- -- -- --  06/20/19 1049 121/74 98.7 F (37.1 C) Oral 94 17 100 % _1  (1.778 m) 130.4 kg   Results for orders placed or performed during the hospital encounter of 06/20/19 (from the past 24 hour(s))  Amnisure rupture of membrane (rom)not at [facility]     Status: None   Collection Time: 06/20/19 10:35 AM  Result Value Ref Range   Amnisure ROM POSITIVE   Urinalysis, Routine w  reflex microscopic     Status: Abnormal   Collection Time: 06/20/19 10:36 AM  Result Value Ref Range   Color, Urine YELLOW YELLOW   APPearance HAZY (A) CLEAR   Specific Gravity, Urine 1.016 1.005 - 1.030   pH 7.0 5.0 - 8.0   Glucose, UA NEGATIVE NEGATIVE mg/dL   Hgb urine dipstick NEGATIVE NEGATIVE   Bilirubin Urine NEGATIVE NEGATIVE   Ketones, ur NEGATIVE NEGATIVE mg/dL   Protein, ur NEGATIVE NEGATIVE mg/dL   Nitrite NEGATIVE NEGATIVE   Leukocytes,Ua NEGATIVE NEGATIVE  Wet prep, genital     Status: Abnormal   Collection Time: 06/20/19 11:31 AM  Result Value Ref Range   Yeast Wet Prep HPF POC NONE SEEN NONE SEEN   Trich, Wet Prep NONE SEEN  NONE SEEN   Clue Cells Wet Prep HPF POC NONE SEEN NONE SEEN   WBC, Wet Prep HPF POC MODERATE (A) NONE SEEN   Sperm NONE SEEN    [place] MFM OB Limited  Result Date: 06/20/2019 ----------------------------------------------------------------------  OBSTETRICS REPORT                       (Signed Final 06/20/2019 12:56 pm) ---------------------------------------------------------------------- Patient Info  ID #:       [id number]                          D.O.B.:  1981/01/21 (38 yrs)  Name:       Cala Bradford               Visit Date: 06/20/2019 12:07 pm ---------------------------------------------------------------------- Performed By  Performed By:     Jacob Moores BS,       Secondary Phy.:   [place], [facility]                                                             [place]  Attending:        Tama High MD        [facility]  Referred By:      [facility] MAU/Triage ---------------------------------------------------------------------- Orders   #  Description                          Code         Ordered By   1  [place] MFM OB LIMITED                    70350.09     Mallie Snooks   ----------------------------------------------------------------------   #  Order #                    Accession #                 Episode #   1  [id number]                  [id number]  ---------------------------------------------------------------------- Indications   [redacted] weeks gestation of pregnancy  Z3A.17   Premature rupture of membranes - leaking       O42.90   fluid   Obesity complicating pregnancy, second         O99.212   trimester   Tobacco use complicating pregnancy,            O99.332   second trimester   Previous cesarean delivery, antepartum         O34.219   Previous cervical surgery (LEEP)               O34.56   Advanced maternal age multigravida 73+,        O25.522   second trimester  ---------------------------------------------------------------------- Fetal Evaluation  Num Of Fetuses:         1  Fetal Heart Rate(bpm):  148  Cardiac Activity:       Observed  Presentation:           Breech  Placenta:               Anterior  P. Cord Insertion:      Not well visualized  Amniotic Fluid  AFI FV:      Subjectively low-normal                              Largest Pocket(cm)                              2.92  Comment:    Separation of the amniotic membrane visualized at uterine              fundus and along Right uterus. ---------------------------------------------------------------------- Biometry  LV:        7.2  mm ---------------------------------------------------------------------- OB History  Blood Type:    A+  Gravidity:    3         Term:   1        Prem:   0        SAB:   0  TOP:          1       Ectopic:  0        Living: 1 ---------------------------------------------------------------------- Gestational Age  LMP:           18w 5d        Date:  02/09/19                 EDD:   11/16/19  Clinical EDD:  17w 6d                                        EDD:   11/22/19  Best:          17w 6d     Det. ByLoman Chroman         EDD:   11/22/19                                       (05/01/19) ---------------------------------------------------------------------- Anatomy  Ventricles:            Visualized             Kidneys:  Visualized  Heart:                 Visualized             Bladder:                Visualized  Stomach:               Visualized  Other:  Technically difficult due to maternal habitus and early GA. ---------------------------------------------------------------------- Cervix Uterus Adnexa  Cervix  Length:           3.98  cm.  Normal appearance by transabdominal scan.  Uterus  No abnormality visualized.  Left Ovary  Within normal limits. No adnexal mass visualized.  Right Ovary  Within normal limits. No adnexal mass visualized.  Cul De Sac  No free fluid seen.  Adnexa  No abnormality visualized. ---------------------------------------------------------------------- Impression  Patient is being evlauated at the MAU for c/o vaginal  discharge.  A limited ultrasound was performed. Amniotic fluid is  significantly reduced (single pocket of 3 cm). Good fetal heart  activity is seen.  Findings are consistent with PPROM that should be  corroborated with clinical evaluation.  Discussed with Mallie Snooks, CNM. ---------------------------------------------------------------------- Recommendations  Follow-up evaluation in a week. ----------------------------------------------------------------------                  Tama High, MD Electronically Signed Final Report   06/20/2019 12:56 pm ----------------------------------------------------------------------  Assessment and Plan  --39 y.o. G3P1011 at [redacted]w[redacted]d --PPROM 06/17/2019 --Discharge home in stable condition with strict precautions  F[facility]U: --[facility] Monday (03/29) or Tuesday (03/30)  SDarlina Rumpf CNM 06/20/2019, 1:30 PM

## 2019-06-21 LAB — GC/CHLAMYDIA PROBE AMP (~~LOC~~) NOT AT ARMC
Chlamydia: NEGATIVE
Comment: NEGATIVE
Comment: NORMAL
Neisseria Gonorrhea: NEGATIVE

## 2019-06-22 ENCOUNTER — Other Ambulatory Visit: Payer: Self-pay | Admitting: Obstetrics and Gynecology

## 2019-06-22 ENCOUNTER — Other Ambulatory Visit: Payer: Self-pay

## 2019-06-22 ENCOUNTER — Other Ambulatory Visit (HOSPITAL_COMMUNITY): Payer: Self-pay | Admitting: *Deleted

## 2019-06-22 ENCOUNTER — Ambulatory Visit (HOSPITAL_COMMUNITY): Payer: Medicaid Other

## 2019-06-22 ENCOUNTER — Ambulatory Visit (HOSPITAL_COMMUNITY)
Admission: RE | Admit: 2019-06-22 | Discharge: 2019-06-22 | Disposition: A | Discharge status: Home / Self Care | Payer: Medicaid Other | Source: Ambulatory Visit | Attending: Obstetrics and Gynecology | Admitting: Obstetrics and Gynecology

## 2019-06-22 DIAGNOSIS — Z363 Encounter for antenatal screening for malformations: Secondary | ICD-10-CM

## 2019-06-22 DIAGNOSIS — O09522 Supervision of elderly multigravida, second trimester: Secondary | ICD-10-CM

## 2019-06-22 DIAGNOSIS — Z3A18 18 weeks gestation of pregnancy: Secondary | ICD-10-CM

## 2019-06-22 DIAGNOSIS — O99212 Obesity complicating pregnancy, second trimester: Secondary | ICD-10-CM

## 2019-06-22 DIAGNOSIS — O09529 Supervision of elderly multigravida, unspecified trimester: Secondary | ICD-10-CM | POA: Insufficient documentation

## 2019-06-22 DIAGNOSIS — O4292 Full-term premature rupture of membranes, unspecified as to length of time between rupture and onset of labor: Secondary | ICD-10-CM

## 2019-06-22 DIAGNOSIS — E669 Obesity, unspecified: Secondary | ICD-10-CM

## 2019-06-22 DIAGNOSIS — Z8759 Personal history of other complications of pregnancy, childbirth and the puerperium: Secondary | ICD-10-CM

## 2019-06-22 NOTE — Consult Note (Signed)
MFM Note  This patient was seen for an ultrasound today as she was diagnosed with probable rupture of membranes in the MAU 2 days ago.  The patient reports that she has been leaking fluid for the past few days.  She denies any other problems in her current pregnancy.    She had a cell free DNA test earlier in her pregnancy which indicated a low risk for trisomy 66, 29, and 13.  A female fetus is predicted.  On today's exam, the overall fetal growth appears appropriate for her gestational age.  Lower normal amniotic fluid is noted around the fetus.  There was a 2 to 3 cm maximal vertical pocket of amniotic fluid noted today.  There also appeared to be some amnion separation from her uterus noted on today's exam.  The views of the fetal anatomy were limited today due to her early gestational age and the low normal amniotic fluid levels.  The poor prognosis for pregnancies with rupture of membranes at her early gestational age was discussed with the patient today.  She was advised that should she truly have ruptured membranes, that there is a significant risk that this may not be a successful pregnancy.  The increased risk of an intrauterine infection, pulmonary hypoplasia, fetal contractures, IUFD, and spontaneous preterm labor resulting in a previable birth due to rupture of membranes at her early gestational age was discussed.  All management options including an induction of labor now versus continued expectant management were discussed with the patient.  She stated that she would like to continue expectant management for now as this is a desired pregnancy.  The patient was advised that expectant management is reasonable as she does not show any signs of an intrauterine infection at this time.    She was advised that on rare occasions, I have seen the membranes reseal and the amniotic fluid levels build back up to normal.  As there still is a lower normal amount of amniotic fluid level around her baby,  she was advised that the risk of pulmonary hypoplasia and fetal contractures is hopefully low.    We will continue to follow her closely with serial ultrasound exams.  A follow-up exam was scheduled in 3 weeks.    Due to PPROM, plans have already been made to admit the patient once the fetus is considered viable and for her to receive a complete course of antenatal corticosteroids at that time.    The signs and symptoms of an intrauterine infection including fevers and shaking chills were discussed with the patient.  She was advised to go to the hospital immediately should she experience fevers or shaking chills or should she have experience lower abdominal cramping.  A total of 30 minutes was spent counseling and coordinating the care for this patient.  Greater than 50% of the time was spent in direct face-to-face contact.

## 2019-06-26 ENCOUNTER — Other Ambulatory Visit: Payer: Self-pay

## 2019-06-26 ENCOUNTER — Ambulatory Visit (INDEPENDENT_AMBULATORY_CARE_PROVIDER_SITE_OTHER): Payer: Medicaid Other | Admitting: Obstetrics & Gynecology

## 2019-06-26 VITALS — BP 119/78 | HR 114 | Wt 285.6 lb

## 2019-06-26 DIAGNOSIS — Z9189 Other specified personal risk factors, not elsewhere classified: Secondary | ICD-10-CM

## 2019-06-26 DIAGNOSIS — O09522 Supervision of elderly multigravida, second trimester: Secondary | ICD-10-CM

## 2019-06-26 DIAGNOSIS — O09529 Supervision of elderly multigravida, unspecified trimester: Secondary | ICD-10-CM

## 2019-06-26 DIAGNOSIS — Z3A18 18 weeks gestation of pregnancy: Secondary | ICD-10-CM

## 2019-06-26 NOTE — Progress Notes (Signed)
   PRENATAL VISIT NOTE  Subjective:  Teresa Sanchez is a 39 y.o. G3P1011 at [redacted]w[redacted]d being seen today for ongoing prenatal care.  She is currently monitored for the following issues for this high-risk pregnancy and has ASCUS with positive high risk HPV cervical; CIN II (cervical intraepithelial neoplasia II); Encounter for supervision of high risk multigravida of advanced maternal age, antepartum; Smoking (tobacco) complicating pregnancy, first trimester; Maternal obesity affecting pregnancy, antepartum; Previous cesarean delivery affecting pregnancy, antepartum; AMA (advanced maternal age) multigravida 35+; and History of LEEP (loop electrosurgical excision procedure) of cervix complicating pregnancy on their problem list.  Patient reports no leaking.  Contractions: Not present. Vag. Bleeding: None.  Movement: Present. Denies leaking of fluid.   The following portions of the patient's history were reviewed and updated as appropriate: allergies, current medications, past family history, past medical history, past social history, past surgical history and problem list.   Objective:   Vitals:   06/26/19 1003  BP: 119/78  Pulse: (!) 114  Weight: 285 lb 9.6 oz (129.5 kg)    Fetal Status: Fetal Heart Rate (bpm): 154   Movement: Present     General:  Alert, oriented and cooperative. Patient is in no acute distress.  Skin: Skin is warm and dry. No rash noted.   Cardiovascular: Normal heart rate noted  Respiratory: Normal respiratory effort, no problems with respiration noted  Abdomen: Soft, gravid, appropriate for gestational age.  Pain/Pressure: Absent     Pelvic: Cervical exam deferred           Extremities: Normal range of motion.  Edema: None  Mental Status: Normal mood and affect. Normal behavior. Normal judgment and thought content.   Assessment and Plan:  Pregnancy: G3P1011 at [redacted]w[redacted]d 1. Encounter for supervision of high risk multigravida of advanced maternal age, antepartum F/u US  2-3 weeks  2. Multigravida of advanced maternal age in second trimester   3. At risk for infection associated with premature rupture of membranes (PROM) No leaking noted today and low nl AF noted on [place]  Preterm labor symptoms and general obstetric precautions including but not limited to vaginal bleeding, contractions, leaking of fluid and fetal movement were reviewed in detail with the patient. Please refer to After Visit Summary for other counseling recommendations.   Return in about 3 weeks (around 07/17/2019) for virtual.  Future Appointments  Date Time Provider Department Center  07/14/2019  7:30 AM [facility] NURSE [facility]  07/14/2019  7:30 AM [facility] [place] 1 [facility]  07/17/2019  9:15 AM Brock Bad, MD [facility] None    Scheryl Darter, MD

## 2019-06-26 NOTE — Patient Instructions (Signed)

## 2019-06-26 NOTE — Progress Notes (Signed)
Patient reports fetal movement, states that she does not feel any fluid leaking today, denies pain.

## 2019-07-14 ENCOUNTER — Other Ambulatory Visit: Payer: Self-pay

## 2019-07-14 ENCOUNTER — Ambulatory Visit (HOSPITAL_COMMUNITY)
Admission: RE | Admit: 2019-07-14 | Discharge: 2019-07-14 | Disposition: A | Discharge status: Home / Self Care | Payer: Medicaid Other | Source: Ambulatory Visit | Attending: Obstetrics and Gynecology | Admitting: Obstetrics and Gynecology

## 2019-07-14 ENCOUNTER — Encounter (HOSPITAL_COMMUNITY): Payer: Self-pay

## 2019-07-14 ENCOUNTER — Ambulatory Visit (HOSPITAL_COMMUNITY): Payer: Medicaid Other | Admitting: *Deleted

## 2019-07-14 ENCOUNTER — Other Ambulatory Visit (HOSPITAL_COMMUNITY): Payer: Self-pay | Admitting: *Deleted

## 2019-07-14 DIAGNOSIS — Z362 Encounter for other antenatal screening follow-up: Secondary | ICD-10-CM | POA: Diagnosis not present

## 2019-07-14 DIAGNOSIS — Z3A21 21 weeks gestation of pregnancy: Secondary | ICD-10-CM

## 2019-07-14 DIAGNOSIS — E669 Obesity, unspecified: Secondary | ICD-10-CM

## 2019-07-14 DIAGNOSIS — O99210 Obesity complicating pregnancy, unspecified trimester: Secondary | ICD-10-CM | POA: Insufficient documentation

## 2019-07-14 DIAGNOSIS — O42919 Preterm premature rupture of membranes, unspecified as to length of time between rupture and onset of labor, unspecified trimester: Secondary | ICD-10-CM | POA: Diagnosis not present

## 2019-07-14 DIAGNOSIS — Z8759 Personal history of other complications of pregnancy, childbirth and the puerperium: Secondary | ICD-10-CM

## 2019-07-14 DIAGNOSIS — O34219 Maternal care for unspecified type scar from previous cesarean delivery: Secondary | ICD-10-CM

## 2019-07-14 DIAGNOSIS — O09522 Supervision of elderly multigravida, second trimester: Secondary | ICD-10-CM

## 2019-07-14 DIAGNOSIS — O99212 Obesity complicating pregnancy, second trimester: Secondary | ICD-10-CM

## 2019-07-14 DIAGNOSIS — O42912 Preterm premature rupture of membranes, unspecified as to length of time between rupture and onset of labor, second trimester: Secondary | ICD-10-CM

## 2019-07-17 ENCOUNTER — Encounter: Payer: Self-pay | Admitting: Obstetrics

## 2019-07-17 ENCOUNTER — Telehealth (INDEPENDENT_AMBULATORY_CARE_PROVIDER_SITE_OTHER): Payer: Medicaid Other | Admitting: Obstetrics

## 2019-07-17 VITALS — BP 136/79 | HR 96

## 2019-07-17 DIAGNOSIS — O09522 Supervision of elderly multigravida, second trimester: Secondary | ICD-10-CM

## 2019-07-17 DIAGNOSIS — O99210 Obesity complicating pregnancy, unspecified trimester: Secondary | ICD-10-CM

## 2019-07-17 DIAGNOSIS — O99212 Obesity complicating pregnancy, second trimester: Secondary | ICD-10-CM

## 2019-07-17 DIAGNOSIS — Z3A21 21 weeks gestation of pregnancy: Secondary | ICD-10-CM

## 2019-07-17 DIAGNOSIS — O34219 Maternal care for unspecified type scar from previous cesarean delivery: Secondary | ICD-10-CM

## 2019-07-17 DIAGNOSIS — O09529 Supervision of elderly multigravida, unspecified trimester: Secondary | ICD-10-CM

## 2019-07-17 NOTE — Progress Notes (Signed)
OBSTETRICS PRENATAL VIRTUAL VISIT ENCOUNTER NOTE  Provider location: [facility] [place]   I connected with Teresa Sanchez on 07/17/19 at  9:15 AM EDT by MyChart Video Encounter at home and verified that I am speaking with the correct person using two identifiers.   I discussed the limitations, risks, security and privacy concerns of performing an evaluation and management service virtually and the availability of in person appointments. I also discussed with the patient that there may be a patient responsible charge related to this service. The patient expressed understanding and agreed to proceed. Subjective:  Teresa Sanchez is a 39 y.o. G3P1011 at [redacted]w[redacted]d being seen today for ongoing prenatal care.  She is currently monitored for the following issues for this high-risk pregnancy and has ASCUS with positive high risk HPV cervical; CIN II (cervical intraepithelial neoplasia II); Encounter for supervision of high risk multigravida of advanced maternal age, antepartum; Smoking (tobacco) complicating pregnancy, first trimester; Maternal obesity affecting pregnancy, antepartum; Previous cesarean delivery affecting pregnancy, antepartum; AMA (advanced maternal age) multigravida 35+; and History of LEEP (loop electrosurgical excision procedure) of cervix complicating pregnancy on their problem list.  Patient reports no complaints.  Contractions: Not present. Vag. Bleeding: None.  Movement: Present. Denies any leaking of fluid.   The following portions of the patient's history were reviewed and updated as appropriate: allergies, current medications, past family history, past medical history, past social history, past surgical history and problem list.   Objective:   Vitals:   07/17/19 0852  BP: 136/79  Pulse: 96    Fetal Status:     Movement: Present     General:  Alert, oriented and cooperative. Patient is in no acute distress.  Respiratory: Normal respiratory effort,  no problems with respiration noted  Mental Status: Normal mood and affect. Normal behavior. Normal judgment and thought content.  Rest of physical exam deferred due to type of encounter  Imaging: [place] MFM OB DETAIL +14 WK  Result Date: 06/22/2019 ----------------------------------------------------------------------  OBSTETRICS REPORT                       (Signed Final 06/22/2019 11:33 am) ---------------------------------------------------------------------- Patient Info  ID #:       [id number]                          D.O.B.:  [birth year] (38 yrs)  Name:       Teresa Sanchez               Visit Date: 06/22/2019 10:21 am ---------------------------------------------------------------------- Performed By  Performed By:     Marcellina Millin          Secondary Phy.:   [employer] [facility]                    RDMS  Attending:        Ma Rings MD         Address:          [address]  [address]                                                             [place]                                                             [address]  Referred By:      Gigi Gin                  [facility]                                 [facility] ---------------------------------------------------------------------- Orders   #  Description                          Code         Ordered By   1  [place] MFM OB DETAIL +14 WK              76811.01     PEGGY CON[facility]ANT  ----------------------------------------------------------------------   #  Order #                    Accession #                 Episode #   1  [id number]                  [id number]                  [id number]  ---------------------------------------------------------------------- Indications   Advanced maternal age multigravida 43+,        O74.522   second trimester   Encounter for antenatal screening for           Z36.3   malformations (low risk NIPS)   [redacted] weeks gestation of pregnancy                Z3A.18   Premature rupture of membranes - leaking       O42.90   fluid   Obesity complicating pregnancy, second         O99.212   trimester (low risk NIPS)   Previous cesarean delivery, antepartum         O34.219  ---------------------------------------------------------------------- Fetal Evaluation  Num Of Fetuses:         1  Fetal Heart Rate(bpm):  153  Cardiac Activity:       Observed  Presentation:           Cephalic  Placenta:               Anterior, low-lying, 1.7cm from int os  P. Cord Insertion:      Visualized  Amniotic Fluid  AFI FV:      Subjectively low-normal  Largest Pocket(cm)                              2.4 ---------------------------------------------------------------------- Biometry  BPD:        40  mm     G. Age:  18w 1d         51  %    CI:        71.44   %    70 - 86                                                          FL/HC:      18.2   %    15.8 - 18  HC:      150.7  mm     G. Age:  18w 1d         41  %    HC/AC:      1.26        1.07 - 1.29  AC:      119.8  mm     G. Age:  17w 5d         31  %    FL/BPD:     68.5   %  FL:       27.4  mm     G. Age:  18w 3d         53  %    FL/AC:      22.9   %    20 - 24  HUM:      25.3  mm     G. Age:  18w 0d         49  %  CER:      19.5  mm     G. Age:  18w 5d         69  %  Est. FW:     220  gm      0 lb 8 oz     37  % ---------------------------------------------------------------------- OB History  Blood Type:    A+  Gravidity:    3         Term:   1        Prem:   0        SAB:   0  TOP:          1       Ectopic:  0        Living: 1 ---------------------------------------------------------------------- Gestational Age  LMP:           19w 0d        Date:  02/09/19                 EDD:   11/16/19  Clinical EDD:  18w 1d                                        EDD:   11/22/19  U/S Today:     18w 1d  EDD:   11/22/19  Best:          18w 1d     Det. ByLoman Chroman         EDD:   11/22/19                                      (05/01/19) ---------------------------------------------------------------------- Anatomy  Cranium:               Appears normal         Aortic Arch:            Not well visualized  Cavum:                 Appears normal         Ductal Arch:            Appears normal  Ventricles:            Appears normal         Diaphragm:              Not well visualized  Choroid Plexus:        Appears normal         Stomach:                Appears normal, left                                                                        sided  Cerebellum:            Appears normal         Abdomen:                Appears normal  Posterior Fossa:       Not well visualized    Abdominal Wall:         Not well visualized  Nuchal Fold:           Not well visualized    Cord Vessels:           Appears normal (3                                                                        vessel cord)  Face:                  Appears normal         Kidneys:                Appear normal                         (orbits and profile)  Lips:                  Appears normal         Bladder:  Appears normal  Thoracic:              Appears normal         Spine:                  Not well visualized  Heart:                 Appears normal         Upper Extremities:      Appears normal                         (4CH, axis, and                         situs)  RVOT:                  Appears normal         Lower Extremities:      Appears normal  LVOT:                  Appears normal  Other:  Fetus appears to be a female. Technically difficult due to low amniotic          fluid. ---------------------------------------------------------------------- Cervix Uterus Adnexa  Cervix  Length:            3.2  cm.  Normal appearance by transabdominal scan.  Adnexa  No abnormality visualized.  ---------------------------------------------------------------------- Comments  This patient was seen for an ultrasound today as she was  diagnosed with probable rupture of membranes in the MAU 2  days ago.  The patient reports that she has been leaking fluid  for the past few days.  She denies any other problems in her  current pregnancy.  She had a cell free DNA test earlier in her pregnancy which  indicated a low risk for trisomy 37, 76, and 13.  A female fetus  is predicted.  On today's exam, the overall fetal growth appears appropriate  for her gestational age.  Lower normal amniotic fluid is noted  around the fetus.  There was a 2 to 3 cm maximal vertical  pocket of amniotic fluid noted today.  There also appeared to  be some amnion separation from her uterus noted on today's  exam.  The views of the fetal anatomy were limited today due to her  early gestational age and the low normal amniotic fluid levels.  The poor prognosis for pregnancies with rupture of  membranes at her early gestational age was discussed with  the patient today.  She was advised that should she truly  have ruptured membranes, that there is a significant risk that  this may not be a successful pregnancy.  The increased risk  of an intrauterine infection, pulmonary hypoplasia, fetal  contractures, IUFD, and spontaneous preterm labor resulting  in a previable birth due to rupture of membranes at her early  gestational age was discussed.  All management options  including an induction of labor now versus continued  expectant management were discussed with the patient.  She  stated that she would like to continue expectant management  for now as this is a desired pregnancy.  The patient was advised that expectant management is  reasonable as she does not show any signs of an intrauterine  infection at this time.  She was advised that on rare occasions, I have seen the  membranes reseal and the  amniotic fluid levels build back up  to normal.   As there still is a lower normal amount of amniotic  fluid level around her baby, she was advised that the risk of  pulmonary hypoplasia and fetal contractures is hopefully low.  We will continue to follow her closely with serial ultrasound  exams.  A follow-up exam was scheduled in 3 weeks.  Due to PPROM, plans have already been made to admit the  patient once the fetus is considered viable and for her to  receive a complete course of antenatal corticosteroids at that  time.  The signs and symptoms of an intrauterine infection including  fevers and shaking chills were discussed with the patient.  She was advised to go to the hospital immediately should she  experience fevers or shaking chills or should she have  experience lower abdominal cramping.  A total of 30 minutes was spent counseling and coordinating  the care for this patient.  Greater than 50% of the time was  spent in direct face-to-face contact. ----------------------------------------------------------------------                   Ma Rings, MD Electronically Signed Final Report   06/22/2019 11:33 am ----------------------------------------------------------------------  [place] MFM OB FOLLOW UP  Result Date: 07/14/2019 ----------------------------------------------------------------------  OBSTETRICS REPORT                       (Signed Final 07/14/2019 11:09 am) ---------------------------------------------------------------------- Patient Info  ID #:       [id number]                          D.O.B.:  Dec 07, 1980 (38 yrs)  Name:       Teresa Sanchez               Visit Date: 07/14/2019 08:07 am ---------------------------------------------------------------------- Performed By  Performed By:     Eden Lathe BS      Secondary Phy.:   [facility]                    RDMS RVT  Attending:        Noralee Space MD        Address:          [address]                                                              [address]                                                             [place]  [address]  Referred By:      Gigi Gin                  [facility]                                 [facility]etal [facility] ---------------------------------------------------------------------- Orders   #  Description                          Code         Ordered By   1  [place] MFM OB FOLLOW UP                  60454.09     Clayton Bibles  ----------------------------------------------------------------------   #  Order #                    Accession #                 Episode #   1  [id number]                  [id number]                  [id number]  ---------------------------------------------------------------------- Indications   Antenatal follow-up for nonvisualized fetal    Z36.2   anatomy   Advanced maternal age multigravida 37+,        O69.522   second trimester   Premature rupture of membranes - leaking       O42.90   fluid   Obesity complicating pregnancy, second         O99.212   trimester (low risk NIPS)   Previous cesarean delivery, antepartum         O34.219   [redacted] weeks gestation of pregnancy                Z3A.21  ---------------------------------------------------------------------- Fetal Evaluation  Num Of Fetuses:         1  Fetal Heart Rate(bpm):  153  Cardiac Activity:       Observed  Presentation:           Cephalic  Placenta:               Anterior  P. Cord Insertion:      Previously Visualized  Amniotic Fluid  AFI FV:      Within normal limits                              Largest Pocket(cm)                              2.68 ---------------------------------------------------------------------- Biometry  BPD:      50.5  mm     G. Age:  34w 2d  49  %    CI:        68.64   %    70 - 86                                                           FL/HC:      18.8   %    15.9 - 20.3  HC:      194.8  mm     G. Age:  21w 5d         58  %    HC/AC:      1.21        1.06 - 1.25  AC:       161   mm     G. Age:  21w 1d         40  %    FL/BPD:     72.7   %  FL:       36.7  mm     G. Age:  21w 5d         54  %    FL/AC:      22.8   %    20 - 24  HUM:      34.2  mm     G. Age:  21w 5d         57  %  CER:      21.9  mm     G. Age:  20w 6d         37  %  CM:        3.8  mm  Est. FW:     424  gm    0 lb 15 oz      53  % ---------------------------------------------------------------------- OB History  Blood Type:    A+  Gravidity:    3         Term:   1        Prem:   0        SAB:   0  TOP:          1       Ectopic:  0        Living: 1 ---------------------------------------------------------------------- Gestational Age  LMP:           22w 1d        Date:  02/09/19                 EDD:   11/16/19  Clinical EDD:  09U 2d                                        EDD:   11/22/19  U/S Today:     04V 3d                                        EDD:   11/21/19  Best:          21w 2d     Det. By:  Marcella Dubs         EDD:  11/22/19                                      (05/01/19) ---------------------------------------------------------------------- Anatomy  Cranium:               Appears normal         Aortic Arch:            Not well visualized  Cavum:                 Appears normal         Ductal Arch:            Appears normal  Ventricles:            Appears normal         Diaphragm:              Appears normal  Choroid Plexus:        Previously seen        Stomach:                Appears normal, left                                                                        sided  Cerebellum:            Previously seen        Abdomen:                Appears normal  Posterior Fossa:       Appears normal         Abdominal Wall:         Not well visualized  Nuchal Fold:           Not well visualized    Cord Vessels:           Previously seen  Face:                  Appears  normal         Kidneys:                Appear normal                         (orbits and profile)  Lips:                  Previously seen        Bladder:                Appears normal  Thoracic:              Appears normal         Spine:                  Not well visualized  Heart:                 Appears normal         Upper Extremities:      Previously seen                         (  4CH, axis, and                         situs)  RVOT:                  Appears normal         Lower Extremities:      Previously seen  LVOT:                  Appears normal  Other:  Fetus appears to be a female. Technically difficult due to low amniotic          fluid. ---------------------------------------------------------------------- Cervix Uterus Adnexa  Uterus  No abnormality visualized.  Left Ovary  Not visualized.  Right Ovary  Not visualized.  Cul De Sac  No free fluid seen.  Adnexa  No abnormality visualized. ---------------------------------------------------------------------- Impression  Patient returned for fetal growth and amniotic fluid  assessments. At 18-weeks' gestation, she had PPROM and  had consultation with Dr. Parke Poisson, MFM.  She does not have fever or vaginal bleeding or uterine  contractions. Patient reports that she stopped leaking  amniotic fluid about 2 weeks ago.  On ultrasound, 2 clearly measurable pockets of amniotic fluid  (4 cm and 3 cm) were seen in the lower quadrants. Good  fetal activity is seen. Fetal growth is appropriate for  gestational age.  My impression is that she has reduced but adequate amniotic  fluid.  I reassured the patient of the findings. I explained that if she  does not have any further leakage of amniotic fluid and the  fluid is low-normal to normal, she can be followed as  outpatient. I advised her to abstain from sexual intercourse.  Patient strongly desires outpatient management but  understands that inpatient management may be advised  based on her symptoms and ultrasound findings.  ---------------------------------------------------------------------- Recommendations  -Follow-up limited scan in 2 weeks to assess amniotic fluid. ----------------------------------------------------------------------                  Noralee Space, MD Electronically Signed Final Report   07/14/2019 11:09 am ----------------------------------------------------------------------  [place] MFM OB Limited  Result Date: 06/20/2019 ----------------------------------------------------------------------  OBSTETRICS REPORT                       (Signed Final 06/20/2019 12:56 pm) ---------------------------------------------------------------------- Patient Info  ID #:       [id number]                          D.O.B.:  15-Aug-1980 (38 yrs)  Name:       Teresa Sanchez               Visit Date: 06/20/2019 12:07 pm ---------------------------------------------------------------------- Performed By  Performed By:     Tommie Raymond BS,       Secondary Phy.:   [facility]                    RDMS, RVT                                                             [facility]  Attending:        Noralee Space MD  [facility]  Referred By:      [facility] MAU/Triage ---------------------------------------------------------------------- Orders   #  Description                          Code         Ordered By   1  [place] MFM OB LIMITED                    [address].60     Clayton Bibles  ----------------------------------------------------------------------   #  Order #                    Accession #                 Episode #   1  [id number]                  [id number]                  [id number]  ---------------------------------------------------------------------- Indications   [redacted] weeks gestation of pregnancy                Z3A.17   Premature rupture of membranes - leaking       O42.90   fluid   Obesity  complicating pregnancy, second         O99.212   trimester   Tobacco use complicating pregnancy,            O99.332   second trimester   Previous cesarean delivery, antepartum         O34.219   Previous cervical surgery (LEEP)               O34.50   Advanced maternal age multigravida 68+,        O37.522   second trimester  ---------------------------------------------------------------------- Fetal Evaluation  Num Of Fetuses:         1  Fetal Heart Rate(bpm):  148  Cardiac Activity:       Observed  Presentation:           Breech  Placenta:               Anterior  P. Cord Insertion:      Not well visualized  Amniotic Fluid  AFI FV:      Subjectively low-normal                              Largest Pocket(cm)                              2.92  Comment:    Separation of the  amniotic membrane visualized at uterine              fundus and along Right uterus. ---------------------------------------------------------------------- Biometry  LV:        7.2  mm ---------------------------------------------------------------------- OB History  Blood Type:    A+  Gravidity:    3         Term:   1        Prem:   0        SAB:   0  TOP:          1       Ectopic:  0        Living: 1 ---------------------------------------------------------------------- Gestational Age  LMP:           18w 5d        Date:  02/09/19                 EDD:   11/16/19  Clinical EDD:  17w 6d                                        EDD:   11/22/19  Best:          17w 6d     Det. By:  Marcella Dubs         EDD:   11/22/19                                      (05/01/19) ---------------------------------------------------------------------- Anatomy  Ventricles:            Visualized             Kidneys:                Visualized  Heart:                 Visualized             Bladder:                Visualized  Stomach:               Visualized  Other:  Technically difficult due to maternal habitus and early GA.  ---------------------------------------------------------------------- Cervix Uterus Adnexa  Cervix  Length:           3.98  cm.  Normal appearance by transabdominal scan.  Uterus  No abnormality visualized.  Left Ovary  Within normal limits. No adnexal mass visualized.  Right Ovary  Within normal limits. No adnexal mass visualized.  Cul De Sac  No free fluid seen.  Adnexa  No abnormality visualized. ---------------------------------------------------------------------- Impression  Patient is being evlauated at the MAU for c/o vaginal  discharge.  A limited ultrasound was performed. Amniotic fluid is  significantly reduced (single pocket of 3 cm). Good fetal heart  activity is seen.  Findings are consistent with PPROM that should be  corroborated with clinical evaluation.  Discussed with Clayton Bibles, CNM. ---------------------------------------------------------------------- Recommendations  Follow-up evaluation in a week. ----------------------------------------------------------------------                  Noralee Space, MD Electronically Signed Final Report   06/20/2019 12:56 pm ----------------------------------------------------------------------   Assessment and Plan:  Pregnancy: G3P1011 at [redacted]w[redacted]d 1. Encounter for supervision of high risk multigravida of advanced maternal age, antepartum  2. Previous cesarean delivery affecting pregnancy, antepartum  3. Maternal obesity affecting pregnancy, antepartum   Preterm labor symptoms and general obstetric precautions including but not limited to vaginal bleeding, contractions, leaking of fluid and fetal movement were reviewed in detail with the patient. I discussed the assessment and treatment plan with the patient. The patient was provided an opportunity to ask questions and all were answered. The patient agreed with the plan and demonstrated an understanding of the instructions. The patient was advised to call back or seek an in-person office  evaluation/go to MAU at [facility] for any urgent or concerning symptoms. Please refer to After Visit Summary for other counseling recommendations.   I provided 10 minutes of face-to-face time during this encounter.  Return in about 4 weeks (around 08/14/2019) for MyChart.  Future Appointments  Date Time Provider Department Center  07/17/2019  9:15 AM Brock Bad, MD [facility] None  07/25/2019  7:45 AM [facility] NURSE [facility]  07/25/2019  7:45 AM [facility] [place] 2 [facility]    Coral Ceo, MD [facility], [facility] 07/17/2019

## 2019-07-17 NOTE — Progress Notes (Signed)
S/w patient for virtual visit, pt reports fetal movement, denies pain

## 2019-07-25 ENCOUNTER — Other Ambulatory Visit: Payer: Self-pay

## 2019-07-25 ENCOUNTER — Ambulatory Visit (HOSPITAL_COMMUNITY): Payer: Medicaid Other | Admitting: *Deleted

## 2019-07-25 ENCOUNTER — Encounter (HOSPITAL_COMMUNITY): Payer: Self-pay

## 2019-07-25 ENCOUNTER — Other Ambulatory Visit (HOSPITAL_COMMUNITY): Payer: Self-pay | Admitting: *Deleted

## 2019-07-25 ENCOUNTER — Ambulatory Visit (HOSPITAL_COMMUNITY)
Admission: RE | Admit: 2019-07-25 | Discharge: 2019-07-25 | Disposition: A | Discharge status: Home / Self Care | Payer: Medicaid Other | Source: Ambulatory Visit | Attending: Obstetrics and Gynecology | Admitting: Obstetrics and Gynecology

## 2019-07-25 DIAGNOSIS — O99212 Obesity complicating pregnancy, second trimester: Secondary | ICD-10-CM

## 2019-07-25 DIAGNOSIS — O34219 Maternal care for unspecified type scar from previous cesarean delivery: Secondary | ICD-10-CM | POA: Diagnosis not present

## 2019-07-25 DIAGNOSIS — Z8759 Personal history of other complications of pregnancy, childbirth and the puerperium: Secondary | ICD-10-CM | POA: Diagnosis not present

## 2019-07-25 DIAGNOSIS — O42919 Preterm premature rupture of membranes, unspecified as to length of time between rupture and onset of labor, unspecified trimester: Secondary | ICD-10-CM

## 2019-07-25 DIAGNOSIS — Z3A22 22 weeks gestation of pregnancy: Secondary | ICD-10-CM | POA: Diagnosis not present

## 2019-07-25 DIAGNOSIS — O99210 Obesity complicating pregnancy, unspecified trimester: Secondary | ICD-10-CM | POA: Insufficient documentation

## 2019-07-25 DIAGNOSIS — O42912 Preterm premature rupture of membranes, unspecified as to length of time between rupture and onset of labor, second trimester: Secondary | ICD-10-CM | POA: Diagnosis not present

## 2019-08-03 DIAGNOSIS — H04123 Dry eye syndrome of bilateral lacrimal glands: Secondary | ICD-10-CM | POA: Diagnosis not present

## 2019-08-14 ENCOUNTER — Other Ambulatory Visit: Payer: Self-pay

## 2019-08-14 ENCOUNTER — Telehealth (INDEPENDENT_AMBULATORY_CARE_PROVIDER_SITE_OTHER): Payer: Medicaid Other | Admitting: Advanced Practice Midwife

## 2019-08-14 ENCOUNTER — Ambulatory Visit (HOSPITAL_COMMUNITY): Payer: Medicaid Other | Attending: Obstetrics and Gynecology

## 2019-08-14 ENCOUNTER — Other Ambulatory Visit: Payer: Self-pay | Admitting: *Deleted

## 2019-08-14 ENCOUNTER — Ambulatory Visit: Payer: Medicaid Other | Admitting: *Deleted

## 2019-08-14 DIAGNOSIS — Z8759 Personal history of other complications of pregnancy, childbirth and the puerperium: Secondary | ICD-10-CM | POA: Insufficient documentation

## 2019-08-14 DIAGNOSIS — O99212 Obesity complicating pregnancy, second trimester: Secondary | ICD-10-CM

## 2019-08-14 DIAGNOSIS — O34219 Maternal care for unspecified type scar from previous cesarean delivery: Secondary | ICD-10-CM | POA: Diagnosis not present

## 2019-08-14 DIAGNOSIS — O99210 Obesity complicating pregnancy, unspecified trimester: Secondary | ICD-10-CM

## 2019-08-14 DIAGNOSIS — O4290 Premature rupture of membranes, unspecified as to length of time between rupture and onset of labor, unspecified weeks of gestation: Secondary | ICD-10-CM

## 2019-08-14 DIAGNOSIS — O09529 Supervision of elderly multigravida, unspecified trimester: Secondary | ICD-10-CM

## 2019-08-14 DIAGNOSIS — O42919 Preterm premature rupture of membranes, unspecified as to length of time between rupture and onset of labor, unspecified trimester: Secondary | ICD-10-CM

## 2019-08-14 DIAGNOSIS — Z3A25 25 weeks gestation of pregnancy: Secondary | ICD-10-CM

## 2019-08-14 DIAGNOSIS — Z362 Encounter for other antenatal screening follow-up: Secondary | ICD-10-CM

## 2019-08-14 DIAGNOSIS — E669 Obesity, unspecified: Secondary | ICD-10-CM

## 2019-08-14 DIAGNOSIS — O09522 Supervision of elderly multigravida, second trimester: Secondary | ICD-10-CM

## 2019-08-14 DIAGNOSIS — O3442 Maternal care for other abnormalities of cervix, second trimester: Secondary | ICD-10-CM

## 2019-08-14 NOTE — Progress Notes (Signed)
OBSTETRICS PRENATAL VIRTUAL VISIT ENCOUNTER NOTE  Provider location: Center for [facility] [place]   I connected with Teresa Sanchez on 08/14/19 at  9:15 AM EDT by MyChart Video Encounter at home and verified that I am speaking with the correct person using two identifiers.   I discussed the limitations, risks, security and privacy concerns of performing an evaluation and management service virtually and the availability of in person appointments. I also discussed with the patient that there may be a patient responsible charge related to this service. The patient expressed understanding and agreed to proceed. Subjective:  Teresa Sanchez is a 39 y.o. G3P1011 at [redacted]w[redacted]d being seen today for ongoing prenatal care.  She is currently monitored for the following issues for this high-risk pregnancy and has ASCUS with positive high risk HPV cervical; CIN II (cervical intraepithelial neoplasia II); Encounter for supervision of high risk multigravida of advanced maternal age, antepartum; Smoking (tobacco) complicating pregnancy, first trimester; Maternal obesity affecting pregnancy, antepartum; Previous cesarean delivery affecting pregnancy, antepartum; AMA (advanced maternal age) multigravida 35+; and History of LEEP (loop electrosurgical excision procedure) of cervix complicating pregnancy on their problem list.  Patient reports no complaints.  Contractions: Not present. Vag. Bleeding: None.  Movement: Present. Denies any leaking of fluid.   The following portions of the patient's history were reviewed and updated as appropriate: allergies, current medications, past family history, past medical history, past social history, past surgical history and problem list.   Objective:  There were no vitals filed for this visit.  Fetal Status:     Movement: Present     General:  Alert, oriented and cooperative. Patient is in no acute distress.  Respiratory: Normal respiratory effort, no problems  with respiration noted  Mental Status: Normal mood and affect. Normal behavior. Normal judgment and thought content.  Rest of physical exam deferred due to type of encounter  Imaging: [place] MFM OB FOLLOW UP  Result Date: 08/14/2019 ----------------------------------------------------------------------  OBSTETRICS REPORT                       (Signed Final 08/14/2019 08:47 am) ---------------------------------------------------------------------- Patient Info  ID #:       [id number]                          D.O.B.:  22-Jun-1980 (39 yrs)  Name:       Teresa Sanchez               Visit Date: 08/14/2019 07:49 am ---------------------------------------------------------------------- Performed By  Attending:        Noralee Space MD        Secondary Phy.:   [facility]  Performed By:     Percell Boston          Address:          [address]  [address] [place]  Referred By:      Vickii Chafe                  [facility] ---------------------------------------------------------------------- Orders  #  Description                           Code        Ordered By  1  [place] MFM OB FOLLOW UP                   [phone]    YU FANG ----------------------------------------------------------------------  #  Order #                     Accession #                Episode #  1  [id number]                   [id number]                 [id number] ---------------------------------------------------------------------- Indications  [redacted] weeks gestation of pregnancy                Z3A.25  Encounter for other antenatal screening        Z36.2  follow-up  Premature rupture of membranes -  leaking       O42.90  fluid  Advanced maternal age multigravida 53+,        O25.522  second trimester  Obesity complicating pregnancy, second         O99.212  trimester (low risk NIPS)  Previous cesarean delivery, antepartum         O34.219 ---------------------------------------------------------------------- Fetal Evaluation  Num Of Fetuses:         1  Fetal Heart Rate(bpm):  141  Cardiac Activity:       Observed  Presentation:           Cephalic  Placenta:               Anterior  P. Cord Insertion:      Previously Visualized  Amniotic Fluid  AFI FV:      Within normal limits                              Largest  Pocket(cm)                              3.9 ---------------------------------------------------------------------- Biometry  BPD:      62.1  mm     G. Age:  25w 1d         24  %    CI:        72.06   %    70 - 86                                                          FL/HC:      20.5   %    18.6 - 20.4  HC:      232.8  mm     G. Age:  25w 2d         15  %    HC/AC:      1.05        1.04 - 1.22  AC:      221.4  mm     G. Age:  26w 4d         68  %    FL/BPD:     77.0   %    71 - 87  FL:       47.8  mm     G. Age:  26w 0d         44  %    FL/AC:      21.6   %    20 - 24  Est. FW:     900  gm           2 lb     59  % ---------------------------------------------------------------------- OB History  Blood Type:   A+  Gravidity:    3         Term:   1        Prem:   0        SAB:   0  TOP:          1       Ectopic:  0        Living: 1 ---------------------------------------------------------------------- Gestational Age  LMP:           26w 4d        Date:  02/09/19                 EDD:   11/16/19  Clinical EDD:  25w 5d                                        EDD:   11/22/19  U/S Today:     25w 5d                                        EDD:   11/22/19  Best:          25w 5d     Det. ByMarcella Dubs:  Early Ultrasound         EDD:   11/22/19                                      (  05/01/19)  ---------------------------------------------------------------------- Anatomy  Cranium:               Appears normal         Aortic Arch:            Appears normal  Cavum:                 Previously seen        Ductal Arch:            Previously seen  Ventricles:            Appears normal         Diaphragm:              Previously seen  Choroid Plexus:        Previously seen        Stomach:                Appears normal, left                                                                        sided  Cerebellum:            Previously seen        Abdomen:                Previously seen  Posterior Fossa:       Appears normal         Abdominal Wall:         Appears nml (cord                                                                        insert, abd wall)  Nuchal Fold:           Not applicable (>20    Cord Vessels:           Previously seen                         wks GA)  Face:                  Orbits and profile     Kidneys:                Appear normal                         previously seen  Lips:                  Previously seen        Bladder:                Appears normal  Thoracic:              Appears normal         Spine:  Not well visualized  Heart:                 Appears normal         Upper Extremities:      Previously seen                         (4CH, axis, and                         situs)  RVOT:                  Previously seen        Lower Extremities:      Previously seen  LVOT:                  Appears normal  Other:  Fetus appears to be a female. Technically difficult due to low amniotic          fluid. ---------------------------------------------------------------------- Cervix Uterus Adnexa  Cervix  Not visualized (advanced GA >24wks)  Uterus  No abnormality visualized.  Right Ovary  No adnexal mass visualized.  Left Ovary  No adnexal mass visualized.  Cul De Sac  No free fluid seen.  Adnexa  No abnormality visualized.  ---------------------------------------------------------------------- Impression  PPROM was diagnosed at 18-weeks' gestation. Patient has  not had leakage of amniotic fluid for several weeks.  On ultrasound, amniotic fluid is normal and good fetal activity  is seen. Fetal growth is appropriate for gestational age. Fetal  spine could still not be evaluated because of fetal position.  We reassured the patient of the findings.  Given that she has normal amniotic fluid and absence of  symptom of leakage of amniotic fluid, inpatient management  is not indicated. ---------------------------------------------------------------------- Recommendations  -An appointment was made for her to return in 3 weeks for  fetal growth and amniotic fluid assessments.  -If amniotic fluid remains within normal range, follow-up scans  every 4 weeks should be adequate. ----------------------------------------------------------------------                  Noralee Space, MD Electronically Signed Final Report   08/14/2019 08:47 am ----------------------------------------------------------------------  [place] MFM OB LIMITED  Result Date: 07/25/2019 ----------------------------------------------------------------------  OBSTETRICS REPORT                       (Signed Final 07/25/2019 09:00 am) ---------------------------------------------------------------------- Patient Info  ID #:       [id number]                          D.O.B.:  May 05, 1980 (38 yrs)  Name:       Teresa Sanchez               Visit Date: 07/25/2019 07:54 am ---------------------------------------------------------------------- Performed By  Performed By:     Percell Boston          Secondary Phy.:   [facility]                    [facility]  Attending:        Noralee Space MD        Address:          [address]  [address]                                                             [address]                                                              [place]                                                             [address]  Referred By:      Gigi Gin                  [facility] ---------------------------------------------------------------------- Orders   #  Description                          Code         Ordered By   1  [place] MFM OB LIMITED                    72536.64     Noralee Space  ----------------------------------------------------------------------   #  Order #                    Accession #                 Episode #   1  [id number]                  [id number]                  [id number]  ---------------------------------------------------------------------- Indications   Premature rupture of membranes - leaking       O42.90   fluid   [redacted] weeks gestation of pregnancy                Z3A.29   Advanced maternal age multigravida 76+,        O70.522   second trimester   Obesity complicating pregnancy, second         O99.212   trimester (low risk NIPS)   Previous cesarean delivery, antepartum         O34.219   Encounter for antenatal screening,             Z36.9   unspecified  ---------------------------------------------------------------------- Fetal Evaluation  Num Of Fetuses:         1  Fetal Heart Rate(bpm):  157  Cardiac Activity:       Observed  Presentation:  Transverse, head to maternal right  Placenta:               Anterior  P. Cord Insertion:      Previously Visualized  Amniotic Fluid  AFI FV:      Subjectively decreased                              Largest Pocket(cm)                              3.01 ---------------------------------------------------------------------- OB History  Blood Type:    A+  Gravidity:    3         Term:   1        Prem:   0        SAB:   0  TOP:          1       Ectopic:  0        Living: 1 ---------------------------------------------------------------------- Gestational Age  LMP:           23w  5d        Date:  02/09/19                 EDD:   11/16/19  Clinical EDD:  22w 6d                                        EDD:   11/22/19  Best:          22w 6d     Det. By:  Marcella Dubs         EDD:   11/22/19                                      (05/01/19) ---------------------------------------------------------------------- Anatomy  Thoracic:              Appears normal         Bladder:                Appears normal  Stomach:               Appears normal, left                         sided ---------------------------------------------------------------------- Cervix Uterus Adnexa  Cervix  Length:            3.4  cm.  Normal appearance by transabdominal scan.  Uterus  No abnormality visualized.  Left Ovary  No adnexal mass visualized.  Right Ovary  No adnexal mass visualized.  Cul De Sac  No free fluid seen.  Adnexa  No abnormality visualized. ---------------------------------------------------------------------- Impression  Patient has a diagnosis of PPROM at [redacted] weeks gestation.  Patient reports she has not had leakage of amniotic fluid  since her first episode.  On follow-up ultrasound performed  on 07/14/2019, normal amniotic fluid was seen.  She has no  symptoms of fever or abdominal pain.  On today's ultrasound, amniotic fluid is subjectively  decreased.  But the maximum vertical pocket is normal (3  cm).  Good fetal activity is present.  I explained the findings.  Patient strongly desires outpatient  management.  Given that she has no symptoms of leakage of  amniotic fluid, outpatient management is reasonable. ---------------------------------------------------------------------- Recommendations  -An appointment was made for her to return in 2 weeks for  fetal growth and amniotic fluid assessment.  -Amniotic fluid assessments every 2 weeks till [redacted] weeks  gestation. ----------------------------------------------------------------------                  Noralee Space, MD Electronically Signed Final Report    07/25/2019 09:00 am ----------------------------------------------------------------------   Assessment and Plan:  Pregnancy: G3P1011 at [redacted]w[redacted]d  1. Maternal obesity affecting pregnancy, antepartum   2. Previous cesarean delivery affecting pregnancy, antepartum --Desires VBAC, needs consent  3. Encounter for supervision of high risk multigravida of advanced maternal age, antepartum --Pt reports good fetal movement, denies cramping, LOF, or vaginal bleeding --Pt interested in waterbirth. Discussed VBAC as contraindication but pt can see midwives, have a birth plan, take childbirth classes and have natural childbirth if desired.  Pt states understanding.  Mychart message sent with childbirth class info. --Anticipatory guidance about next visits/weeks of pregnancy given.   Preterm labor symptoms and general obstetric precautions including but not limited to vaginal bleeding, contractions, leaking of fluid and fetal movement were reviewed in detail with the patient. I discussed the assessment and treatment plan with the patient. The patient was provided an opportunity to ask questions and all were answered. The patient agreed with the plan and demonstrated an understanding of the instructions. The patient was advised to call back or seek an in-person office evaluation/go to [facility] at [facility] for any urgent or concerning symptoms. Please refer to After Visit Summary for other counseling recommendations.   I provided 10 minutes of face-to-face time during this encounter.  No follow-ups on file.  Future Appointments  Date Time Provider Department Center  09/04/2019  8:15 AM [facility] US2 [facility]    Sharen Counter, CNM [facility] for [employer]

## 2019-08-14 NOTE — Progress Notes (Signed)
Please look at questions from Pt email encounter on 5/15.  She has several questions about delivery.  Pt had u/s this morning.

## 2019-09-04 ENCOUNTER — Other Ambulatory Visit: Payer: Self-pay

## 2019-09-04 ENCOUNTER — Ambulatory Visit: Payer: Medicaid Other | Admitting: *Deleted

## 2019-09-04 ENCOUNTER — Ambulatory Visit: Payer: Medicaid Other | Attending: Obstetrics and Gynecology

## 2019-09-04 ENCOUNTER — Other Ambulatory Visit: Payer: Self-pay | Admitting: *Deleted

## 2019-09-04 DIAGNOSIS — O4290 Premature rupture of membranes, unspecified as to length of time between rupture and onset of labor, unspecified weeks of gestation: Secondary | ICD-10-CM | POA: Diagnosis not present

## 2019-09-04 DIAGNOSIS — Z362 Encounter for other antenatal screening follow-up: Secondary | ICD-10-CM

## 2019-09-04 DIAGNOSIS — O99213 Obesity complicating pregnancy, third trimester: Secondary | ICD-10-CM

## 2019-09-04 DIAGNOSIS — O99333 Smoking (tobacco) complicating pregnancy, third trimester: Secondary | ICD-10-CM

## 2019-09-04 DIAGNOSIS — O09529 Supervision of elderly multigravida, unspecified trimester: Secondary | ICD-10-CM | POA: Diagnosis not present

## 2019-09-04 DIAGNOSIS — Z3A28 28 weeks gestation of pregnancy: Secondary | ICD-10-CM

## 2019-09-04 DIAGNOSIS — O99210 Obesity complicating pregnancy, unspecified trimester: Secondary | ICD-10-CM

## 2019-09-04 DIAGNOSIS — O34219 Maternal care for unspecified type scar from previous cesarean delivery: Secondary | ICD-10-CM | POA: Diagnosis not present

## 2019-09-04 DIAGNOSIS — O09523 Supervision of elderly multigravida, third trimester: Secondary | ICD-10-CM | POA: Diagnosis not present

## 2019-09-04 DIAGNOSIS — E669 Obesity, unspecified: Secondary | ICD-10-CM

## 2019-09-04 DIAGNOSIS — F17200 Nicotine dependence, unspecified, uncomplicated: Secondary | ICD-10-CM

## 2019-09-05 ENCOUNTER — Ambulatory Visit (INDEPENDENT_AMBULATORY_CARE_PROVIDER_SITE_OTHER): Payer: Medicaid Other | Admitting: Student

## 2019-09-05 ENCOUNTER — Other Ambulatory Visit: Payer: Medicaid Other

## 2019-09-05 VITALS — BP 121/78 | Wt 299.0 lb

## 2019-09-05 DIAGNOSIS — O09529 Supervision of elderly multigravida, unspecified trimester: Secondary | ICD-10-CM

## 2019-09-05 DIAGNOSIS — Z3A28 28 weeks gestation of pregnancy: Secondary | ICD-10-CM

## 2019-09-05 DIAGNOSIS — O09523 Supervision of elderly multigravida, third trimester: Secondary | ICD-10-CM

## 2019-09-05 NOTE — Patient Instructions (Signed)
Take blood pressure once a week. If the first number is greater than 139 and/or the second number is greater than 89, please rest, drink something, and then recheck after 15 minutes. Warning signs would be pressures that are 140/90 or greater and any warning signs like headache that does not resolve with Tylenol, water, food, blurry vision, floating spots.  If that happens, come to Maternity Admissions for assessment.

## 2019-09-05 NOTE — Progress Notes (Signed)
ROB  2 hr GTT.  T-Dap declined   CC:  Pt notes pain in buttock area. Pt wants to discuss weight gain.

## 2019-09-05 NOTE — Progress Notes (Signed)
Patient ID: Teresa Sanchez, female   DOB: 1980-12-05, 39 y.o.   MRN: [id number]   PRENATAL VISIT NOTE  Subjective:  Teresa Sanchez is a 39 y.o. G3P1011 at [redacted]w[redacted]d being seen today for ongoing prenatal care.  She is currently monitored for the following issues for this low-risk pregnancy and has ASCUS with positive high risk HPV cervical; CIN II (cervical intraepithelial neoplasia II); Encounter for supervision of high risk multigravida of advanced maternal age, antepartum; Smoking (tobacco) complicating pregnancy, first trimester; Maternal obesity affecting pregnancy, antepartum; Previous cesarean delivery affecting pregnancy, antepartum; AMA (advanced maternal age) multigravida 35+; and History of LEEP (loop electrosurgical excision procedure) of cervix complicating pregnancy on their problem list.  Patient reports no complaints.  Contractions: Not present. Vag. Bleeding: None.  Movement: Present. Denies leaking of fluid.   The following portions of the patient's history were reviewed and updated as appropriate: allergies, current medications, past family history, past medical history, past social history, past surgical history and problem list.   Objective:   Vitals:   09/05/19 0824  BP: 121/78  Weight: 299 lb (135.6 kg)    Fetal Status: Fetal Heart Rate (bpm): 145 Fundal Height: 30 cm Movement: Present     General:  Alert, oriented and cooperative. Patient is in no acute distress.  Skin: Skin is warm and dry. No rash noted.   Cardiovascular: Normal heart rate noted  Respiratory: Normal respiratory effort, no problems with respiration noted  Abdomen: Soft, gravid, appropriate for gestational age.  Pain/Pressure: Present     Pelvic: Cervical exam deferred        Extremities: Normal range of motion.  Edema: None  Mental Status: Normal mood and affect. Normal behavior. Normal judgment and thought content.   Assessment and Plan:  Pregnancy: G3P1011 at [redacted]w[redacted]d 1. Encounter for  supervision of high risk multigravida of advanced maternal age, antepartum -Signed BTL today; patient is not sure but she signed it just in case -reviewed appropriate weight gain.  -Patient understands she needs to come back and sign VBAC with MD - Glucose Tolerance, 2 Hours w/1 Hour - CBC - HIV Antibody (routine testing w rflx) - RPR  Preterm labor symptoms and general obstetric precautions including but not limited to vaginal bleeding, contractions, leaking of fluid and fetal movement were reviewed in detail with the patient. Please refer to After Visit Summary for other counseling recommendations.   Return in about 2 weeks (around 09/19/2019), or in person with MD to sign VBAC consent.  Future Appointments  Date Time Provider Department Center  10/03/2019 11:30 AM [facility] NURSE [facility]  10/03/2019 11:30 AM [facility] US3 [facility]    Marylene Land, CNM

## 2019-09-06 LAB — CBC
Hematocrit: 29.6 % — ABNORMAL LOW (ref 34.0–46.6)
Hemoglobin: 9.8 g/dL — ABNORMAL LOW (ref 11.1–15.9)
MCH: 31.4 pg (ref 26.6–33.0)
MCHC: 33.1 g/dL (ref 31.5–35.7)
MCV: 95 fL (ref 79–97)
Platelets: 290 10*3/uL (ref 150–450)
RBC: 3.12 x10E6/uL — ABNORMAL LOW (ref 3.77–5.28)
RDW: 11.2 % — ABNORMAL LOW (ref 11.7–15.4)
WBC: 10.9 10*3/uL — ABNORMAL HIGH (ref 3.4–10.8)

## 2019-09-06 LAB — GLUCOSE TOLERANCE, 2 HOURS W/ 1HR
Glucose, 1 hour: 161 mg/dL (ref 65–179)
Glucose, 2 hour: 121 mg/dL (ref 65–152)
Glucose, Fasting: 85 mg/dL (ref 65–91)

## 2019-09-06 LAB — RPR: RPR Ser Ql: NONREACTIVE

## 2019-09-06 LAB — HIV ANTIBODY (ROUTINE TESTING W REFLEX): HIV Screen 4th Generation wRfx: NONREACTIVE

## 2019-09-19 ENCOUNTER — Other Ambulatory Visit: Payer: Self-pay

## 2019-09-19 ENCOUNTER — Ambulatory Visit (INDEPENDENT_AMBULATORY_CARE_PROVIDER_SITE_OTHER): Payer: Medicaid Other | Admitting: Obstetrics and Gynecology

## 2019-09-19 VITALS — BP 119/74 | HR 89 | Temp 97.6°F | Wt 298.0 lb

## 2019-09-19 DIAGNOSIS — O3443 Maternal care for other abnormalities of cervix, third trimester: Secondary | ICD-10-CM

## 2019-09-19 DIAGNOSIS — O09523 Supervision of elderly multigravida, third trimester: Secondary | ICD-10-CM

## 2019-09-19 DIAGNOSIS — O99210 Obesity complicating pregnancy, unspecified trimester: Secondary | ICD-10-CM

## 2019-09-19 DIAGNOSIS — Z9889 Other specified postprocedural states: Secondary | ICD-10-CM

## 2019-09-19 DIAGNOSIS — E669 Obesity, unspecified: Secondary | ICD-10-CM

## 2019-09-19 DIAGNOSIS — O34219 Maternal care for unspecified type scar from previous cesarean delivery: Secondary | ICD-10-CM

## 2019-09-19 DIAGNOSIS — O09529 Supervision of elderly multigravida, unspecified trimester: Secondary | ICD-10-CM

## 2019-09-19 NOTE — Progress Notes (Signed)
ROB 2hr GTT WNL   BTL formed corrected and signed today.    CC: None

## 2019-09-19 NOTE — Progress Notes (Signed)
PRENATAL VISIT NOTE  Subjective:  Teresa Sanchez is a 39 y.o. G3P1011 at [redacted]w[redacted]d being seen today for ongoing prenatal care.  She is currently monitored for the following issues for this high-risk pregnancy and has ASCUS with positive high risk HPV cervical; CIN II (cervical intraepithelial neoplasia II); Encounter for supervision of high risk multigravida of advanced maternal age, antepartum; Smoking (tobacco) complicating pregnancy, first trimester; Maternal obesity affecting pregnancy, antepartum; Previous cesarean delivery affecting pregnancy, antepartum; AMA (advanced maternal age) multigravida 35+; and History of LEEP (loop electrosurgical excision procedure) of cervix complicating pregnancy on their problem list.  Patient doing well with no acute concerns today. She reports no complaints.  Contractions: Not present. Vag. Bleeding: None.  Movement: Present. Denies leaking of fluid.   The following portions of the patient's history were reviewed and updated as appropriate: allergies, current medications, past family history, past medical history, past social history, past surgical history and problem list. Problem list updated.  Objective:   Vitals:   09/19/19 0916  BP: 119/74  Pulse: 89  Weight: 298 lb (135.2 kg)    Fetal Status: Fetal Heart Rate (bpm): 152   Movement: Present     General:  Alert, oriented and cooperative. Patient is in no acute distress.  Skin: Skin is warm and dry. No rash noted.   Cardiovascular: Normal heart rate noted  Respiratory: Normal respiratory effort, no problems with respiration noted  Abdomen: Soft, gravid, appropriate for gestational age.  Pain/Pressure: Absent     Pelvic: Cervical exam deferred        Extremities: Normal range of motion.  Edema: None  Mental Status:  Normal mood and affect. Normal behavior. Normal judgment and thought content.   Assessment and Plan:  Pregnancy: G3P1011 at [redacted]w[redacted]d  1. History of loop electrosurgical excision  procedure (LEEP) of cervix affecting pregnancy in third trimester   2. Encounter for supervision of high risk multigravida of advanced maternal age, antepartum   3. Maternal obesity affecting pregnancy, antepartum   4. Previous cesarean delivery affecting pregnancy, antepartum [facility] Attending Consult Note  39 y.o. G3P1011 at [redacted]w[redacted]d with Estimated Date of Delivery: 11/22/19 was seen today in office to discuss trial of labor after cesarean section (TOLAC) versus elective repeat cesarean delivery (ERCD). The following risks were discussed with the patient.  Risk of uterine rupture at term is 0.78 percent with TOLAC and 0.22 percent with ERCD. 1 in 10 uterine ruptures will result in neonatal death or neurological injury. The benefits of a trial of labor after cesarean (TOLAC) resulting in a vaginal birth after cesarean (VBAC) include the following: shorter length of hospital stay and postpartum recovery (in most cases); fewer complications, such as postpartum fever, wound or uterine infection, thromboembolism (blood clots in the leg or lung), need for blood transfusion and fewer neonatal breathing problems. The risks of an attempted VBAC or TOLAC include the following: . Risk of failed trial of labor after cesarean (TOLAC) without a vaginal birth after cesarean (VBAC) resulting in repeat cesarean delivery (RCD) in about 20 to 51 percent of women who attempt VBAC.  Marland Kitchen Risk of rupture of uterus resulting in an emergency cesarean delivery. The risk of uterine rupture may be related in part to the type of uterine incision made during the first cesarean delivery. A previous transverse uterine incision has the lowest risk of rupture (0.2 to 1.5 percent risk). Vertical or T-shaped uterine incisions have a higher risk of uterine rupture (4 to 9 percent risk)The  risk of fetal death is very low with both VBAC and elective repeat cesarean delivery (ERCD), but the likelihood of fetal death is higher  with VBAC than with ERCD. Maternal death is very rare with either type of delivery. The risks of an elective repeat cesarean delivery (ERCD) were reviewed with the patient including but not limited to: 04/998 risk of uterine rupture which could have serious consequences, bleeding which may require transfusion; infection which may require antibiotics; injury to bowel, bladder or other surrounding organs (bowel, bladder, ureters); injury to the fetus; need for additional procedures including hysterectomy in the event of a life-threatening hemorrhage; thromboembolic phenomenon; abnormal placentation; incisional problems; death and other postoperative or anesthesia complications.    These risks and benefits are summarized on the consent form, which was reviewed with the patient during the visit.  All her questions answered and she signed a consent indicating a preference for TOLAC/ERCD. A copy of the consent was given to the patient.  (She does say that she would want tubal sterilization in the event a cesarean section is performed due to any maternal-fetal indication.)  (Patient also desires bilateral tubal sterilization (BTS) at the time of RCD, also discussed the additional risks of regret, failure rate of 0.5-1% with increased risk of ectopic gestation and permanent and irreversible nature of the procedure.  Also discussed possibility of post-tubal pain syndrome.  Other forms of reversible BCM discussed, also discussed vasectomy, patient desires BTS).  Consent signed today  Will continue routine postpartum care.       5. Multigravida of advanced maternal age in third trimester   Preterm labor symptoms and general obstetric precautions including but not limited to vaginal bleeding, contractions, leaking of fluid and fetal movement were reviewed in detail with the patient.  Please refer to After Visit Summary for other counseling recommendations.   Return in about 2 weeks (around 10/03/2019) for  [facility].   Mariel Aloe, MD

## 2019-10-03 ENCOUNTER — Other Ambulatory Visit: Payer: Self-pay | Admitting: Obstetrics and Gynecology

## 2019-10-03 ENCOUNTER — Other Ambulatory Visit: Payer: Self-pay

## 2019-10-03 ENCOUNTER — Ambulatory Visit: Payer: Medicaid Other | Admitting: *Deleted

## 2019-10-03 ENCOUNTER — Other Ambulatory Visit: Payer: Self-pay | Admitting: *Deleted

## 2019-10-03 ENCOUNTER — Ambulatory Visit: Payer: Medicaid Other | Attending: Obstetrics and Gynecology

## 2019-10-03 ENCOUNTER — Ambulatory Visit (INDEPENDENT_AMBULATORY_CARE_PROVIDER_SITE_OTHER): Payer: Medicaid Other | Admitting: Obstetrics and Gynecology

## 2019-10-03 ENCOUNTER — Encounter: Payer: Self-pay | Admitting: Obstetrics and Gynecology

## 2019-10-03 VITALS — BP 118/76 | HR 98 | Wt 302.5 lb

## 2019-10-03 DIAGNOSIS — Z3A32 32 weeks gestation of pregnancy: Secondary | ICD-10-CM

## 2019-10-03 DIAGNOSIS — O99333 Smoking (tobacco) complicating pregnancy, third trimester: Secondary | ICD-10-CM

## 2019-10-03 DIAGNOSIS — O3443 Maternal care for other abnormalities of cervix, third trimester: Secondary | ICD-10-CM

## 2019-10-03 DIAGNOSIS — O99210 Obesity complicating pregnancy, unspecified trimester: Secondary | ICD-10-CM

## 2019-10-03 DIAGNOSIS — O4290 Premature rupture of membranes, unspecified as to length of time between rupture and onset of labor, unspecified weeks of gestation: Secondary | ICD-10-CM

## 2019-10-03 DIAGNOSIS — Z362 Encounter for other antenatal screening follow-up: Secondary | ICD-10-CM

## 2019-10-03 DIAGNOSIS — O34219 Maternal care for unspecified type scar from previous cesarean delivery: Secondary | ICD-10-CM

## 2019-10-03 DIAGNOSIS — O09523 Supervision of elderly multigravida, third trimester: Secondary | ICD-10-CM | POA: Diagnosis not present

## 2019-10-03 DIAGNOSIS — F17200 Nicotine dependence, unspecified, uncomplicated: Secondary | ICD-10-CM

## 2019-10-03 DIAGNOSIS — O99213 Obesity complicating pregnancy, third trimester: Secondary | ICD-10-CM

## 2019-10-03 DIAGNOSIS — Z9889 Other specified postprocedural states: Secondary | ICD-10-CM

## 2019-10-03 DIAGNOSIS — O42919 Preterm premature rupture of membranes, unspecified as to length of time between rupture and onset of labor, unspecified trimester: Secondary | ICD-10-CM | POA: Insufficient documentation

## 2019-10-03 DIAGNOSIS — O4103X0 Oligohydramnios, third trimester, not applicable or unspecified: Secondary | ICD-10-CM

## 2019-10-03 DIAGNOSIS — O09529 Supervision of elderly multigravida, unspecified trimester: Secondary | ICD-10-CM

## 2019-10-03 DIAGNOSIS — O0990 Supervision of high risk pregnancy, unspecified, unspecified trimester: Secondary | ICD-10-CM | POA: Diagnosis not present

## 2019-10-03 DIAGNOSIS — Z9189 Other specified personal risk factors, not elsewhere classified: Secondary | ICD-10-CM

## 2019-10-03 DIAGNOSIS — E669 Obesity, unspecified: Secondary | ICD-10-CM

## 2019-10-03 DIAGNOSIS — O42913 Preterm premature rupture of membranes, unspecified as to length of time between rupture and onset of labor, third trimester: Secondary | ICD-10-CM

## 2019-10-03 HISTORY — DX: Preterm premature rupture of membranes, unspecified as to length of time between rupture and onset of labor, unspecified trimester: O42.919

## 2019-10-03 NOTE — Procedures (Signed)
Teresa Sanchez 1980/12/24 [redacted]w[redacted]d  Fetus A Non-Stress Test Interpretation for 10/03/19  Indication: Unsatisfactory BPP  Fetal Heart Rate A Mode: External Baseline Rate (A): 135 bpm Variability: Moderate Accelerations: 15 x 15 Decelerations: None Multiple birth?: No  Uterine Activity Mode: Toco Contraction Frequency (min): none noted Resting Tone Palpated: Relaxed Resting Time: Adequate  Interpretation (Fetal Testing) Nonstress Test Interpretation: Reactive Comments: FHR tracing rev'd by Dr. Grace Bushy

## 2019-10-03 NOTE — Progress Notes (Signed)
Pt presents for ROB w/o complaints today.  

## 2019-10-03 NOTE — Progress Notes (Signed)
   PRENATAL VISIT NOTE  Subjective:  Teresa Sanchez is a 39 y.o. G3P1011 at [redacted]w[redacted]d being seen today for ongoing prenatal care.  She is currently monitored for the following issues for this high-risk pregnancy and has ASCUS with positive high risk HPV cervical; CIN II (cervical intraepithelial neoplasia II); Encounter for supervision of high risk multigravida of advanced maternal age, antepartum; Smoking (tobacco) complicating pregnancy, first trimester; Maternal obesity affecting pregnancy, antepartum; Previous cesarean delivery affecting pregnancy, antepartum; AMA (advanced maternal age) multigravida 35+; History of LEEP (loop electrosurgical excision procedure) of cervix complicating pregnancy; and Preterm premature rupture of membranes (PPROM) with unknown onset of labor on their problem list.  Patient reports mild abdominal cramps last night for about 15 minutes.  The cramps were relieved with Gas X and a bowel movement.  The patient denies any fever, chills, SOB, abdominal pain, vaginal discharge, or vaginal bleeding.  .  Contractions: Irritability. Vag. Bleeding: None.  Movement: Present. Denies leaking of fluid.   The following portions of the patient's history were reviewed and updated as appropriate: allergies, current medications, past family history, past medical history, past social history, past surgical history and problem list.   Objective:   Vitals:   10/03/19 0920  BP: 118/76  Pulse: 98  Weight: (!) 137.2 kg    Fetal Status: Fetal Heart Rate (bpm): 143 Fundal Height: 33 cm Movement: Present     General:  Alert, oriented and cooperative. Patient is in no acute distress.  Skin: Skin is warm and dry. No rash noted.   Cardiovascular: Normal heart rate noted  Respiratory: Normal respiratory effort, no problems with respiration noted  Abdomen: Soft, gravid, appropriate for gestational age.  Non-tender     Pelvic: Cervical exam deferred        Extremities: Normal range of  motion.  Edema: None  Mental Status: Normal mood and affect. Normal behavior. Normal judgment and thought content.   Assessment and Plan:  Pregnancy: G3P1011 at [redacted]w[redacted]d 1. Encounter for supervision of high risk multigravida of advanced maternal age, antepartum stable  2. Preterm premature rupture of membranes (PPROM) with unknown onset of labor Patient given precautions for infections, including fever, chills, abdominal pain, or vaginal discharge.  Preterm labor precautions given. Appointment with MFM is scheduled for this afternoon.  3. Previous cesarean delivery affecting pregnancy, antepartum Patient considering TOLAC.  4. Multigravida of advanced maternal age in third trimester Stable.  5. History of loop electrosurgical excision procedure (LEEP) of cervix affecting pregnancy in third trimester   6. Maternal obesity affecting pregnancy, antepartum  7. At risk for infection associated with premature rupture of membranes (PROM) Infection precautions discussed.    Preterm labor symptoms and general obstetric precautions including but not limited to vaginal bleeding, contractions, leaking of fluid and fetal movement were reviewed in detail with the patient. Please refer to After Visit Summary for other counseling recommendations.   Return in about 2 weeks (around 10/17/2019) for [facility].  Future Appointments  Date Time Provider Department Center  10/03/2019 11:30 AM [facility] NURSE [facility]  10/03/2019 11:30 AM [facility] US3 [facility]  10/17/2019  9:30 AM Brock Bad, MD [facility] None    Johnny Bridge, MD

## 2019-10-11 ENCOUNTER — Ambulatory Visit: Payer: Medicaid Other | Admitting: *Deleted

## 2019-10-11 ENCOUNTER — Ambulatory Visit: Payer: Medicaid Other | Attending: Obstetrics and Gynecology

## 2019-10-11 ENCOUNTER — Other Ambulatory Visit: Payer: Self-pay | Admitting: *Deleted

## 2019-10-11 ENCOUNTER — Other Ambulatory Visit: Payer: Self-pay

## 2019-10-11 DIAGNOSIS — O99333 Smoking (tobacco) complicating pregnancy, third trimester: Secondary | ICD-10-CM

## 2019-10-11 DIAGNOSIS — O09523 Supervision of elderly multigravida, third trimester: Secondary | ICD-10-CM

## 2019-10-11 DIAGNOSIS — O34219 Maternal care for unspecified type scar from previous cesarean delivery: Secondary | ICD-10-CM | POA: Insufficient documentation

## 2019-10-11 DIAGNOSIS — O4103X0 Oligohydramnios, third trimester, not applicable or unspecified: Secondary | ICD-10-CM | POA: Diagnosis not present

## 2019-10-11 DIAGNOSIS — O99210 Obesity complicating pregnancy, unspecified trimester: Secondary | ICD-10-CM | POA: Insufficient documentation

## 2019-10-11 DIAGNOSIS — O4290 Premature rupture of membranes, unspecified as to length of time between rupture and onset of labor, unspecified weeks of gestation: Secondary | ICD-10-CM

## 2019-10-11 DIAGNOSIS — Z362 Encounter for other antenatal screening follow-up: Secondary | ICD-10-CM

## 2019-10-11 DIAGNOSIS — O99213 Obesity complicating pregnancy, third trimester: Secondary | ICD-10-CM

## 2019-10-11 DIAGNOSIS — F17200 Nicotine dependence, unspecified, uncomplicated: Secondary | ICD-10-CM

## 2019-10-11 DIAGNOSIS — Z3A34 34 weeks gestation of pregnancy: Secondary | ICD-10-CM

## 2019-10-11 DIAGNOSIS — E669 Obesity, unspecified: Secondary | ICD-10-CM

## 2019-10-17 ENCOUNTER — Encounter: Payer: Self-pay | Admitting: Obstetrics

## 2019-10-17 ENCOUNTER — Ambulatory Visit (INDEPENDENT_AMBULATORY_CARE_PROVIDER_SITE_OTHER): Payer: Medicaid Other | Admitting: Obstetrics

## 2019-10-17 ENCOUNTER — Other Ambulatory Visit: Payer: Self-pay

## 2019-10-17 VITALS — BP 119/78 | HR 90 | Wt 307.0 lb

## 2019-10-17 DIAGNOSIS — O09529 Supervision of elderly multigravida, unspecified trimester: Secondary | ICD-10-CM

## 2019-10-17 DIAGNOSIS — F17200 Nicotine dependence, unspecified, uncomplicated: Secondary | ICD-10-CM

## 2019-10-17 DIAGNOSIS — O3443 Maternal care for other abnormalities of cervix, third trimester: Secondary | ICD-10-CM

## 2019-10-17 DIAGNOSIS — O09523 Supervision of elderly multigravida, third trimester: Secondary | ICD-10-CM

## 2019-10-17 DIAGNOSIS — O99333 Smoking (tobacco) complicating pregnancy, third trimester: Secondary | ICD-10-CM

## 2019-10-17 DIAGNOSIS — O34219 Maternal care for unspecified type scar from previous cesarean delivery: Secondary | ICD-10-CM

## 2019-10-17 DIAGNOSIS — E669 Obesity, unspecified: Secondary | ICD-10-CM

## 2019-10-17 DIAGNOSIS — Z9889 Other specified postprocedural states: Secondary | ICD-10-CM

## 2019-10-17 DIAGNOSIS — Z98891 History of uterine scar from previous surgery: Secondary | ICD-10-CM

## 2019-10-17 DIAGNOSIS — O99210 Obesity complicating pregnancy, unspecified trimester: Secondary | ICD-10-CM

## 2019-10-17 DIAGNOSIS — Z3A34 34 weeks gestation of pregnancy: Secondary | ICD-10-CM

## 2019-10-17 DIAGNOSIS — O42919 Preterm premature rupture of membranes, unspecified as to length of time between rupture and onset of labor, unspecified trimester: Secondary | ICD-10-CM

## 2019-10-17 NOTE — Progress Notes (Signed)
ROB   Discuss Labs    CC: None

## 2019-10-17 NOTE — Progress Notes (Signed)
Subjective:  Teresa Sanchez is a 39 y.o. G3P1011 at [redacted]w[redacted]d being seen today for ongoing prenatal care.  She is currently monitored for the following issues for this high-risk pregnancy and has ASCUS with positive high risk HPV cervical; CIN II (cervical intraepithelial neoplasia II); Encounter for supervision of high risk multigravida of advanced maternal age, antepartum; Smoking (tobacco) complicating pregnancy, first trimester; Maternal obesity affecting pregnancy, antepartum; Previous cesarean delivery affecting pregnancy, antepartum; AMA (advanced maternal age) multigravida 35+; History of LEEP (loop electrosurgical excision procedure) of cervix complicating pregnancy; and Preterm premature rupture of membranes (PPROM) with unknown onset of labor on their problem list.  Patient reports no complaints.  Contractions: Not present. Vag. Bleeding: None.  Movement: Present. Denies leaking of fluid.   The following portions of the patient's history were reviewed and updated as appropriate: allergies, current medications, past family history, past medical history, past social history, past surgical history and problem list. Problem list updated.  Objective:   Vitals:   10/17/19 0953  BP: 119/78  Pulse: 90  Weight: (!) 307 lb (139.3 kg)    Fetal Status:     Movement: Present     General:  Alert, oriented and cooperative. Patient is in no acute distress.  Skin: Skin is warm and dry. No rash noted.   Cardiovascular: Normal heart rate noted  Respiratory: Normal respiratory effort, no problems with respiration noted  Abdomen: Soft, gravid, appropriate for gestational age. Pain/Pressure: Present     Pelvic:  Cervical exam deferred        Extremities: Normal range of motion.  Edema: Trace  Mental Status: Normal mood and affect. Normal behavior. Normal judgment and thought content.   Urinalysis:      Assessment and Plan:  Pregnancy: G3P1011 at [redacted]w[redacted]d   1. Encounter for supervision of high risk  multigravida of advanced maternal age, antepartum  2. Previous cesarean delivery affecting pregnancy, antepartum  3. Preterm premature rupture of membranes (PPROM) with unknown onset of labor  4. Maternal obesity affecting pregnancy, antepartum - Baby ASA Rx  5. History of loop electrosurgical excision procedure (LEEP) of cervix affecting pregnancy in third trimester   Preterm labor symptoms and general obstetric precautions including but not limited to vaginal bleeding, contractions, leaking of fluid and fetal movement were reviewed in detail with the patient. Please refer to After Visit Summary for other counseling recommendations.   Return in about 2 weeks (around 10/31/2019) for ROB with Midwife.   Brock Bad, MD  10/17/19

## 2019-11-02 ENCOUNTER — Ambulatory Visit: Payer: Medicaid Other | Admitting: *Deleted

## 2019-11-02 ENCOUNTER — Other Ambulatory Visit: Payer: Self-pay

## 2019-11-02 ENCOUNTER — Encounter: Payer: Self-pay | Admitting: *Deleted

## 2019-11-02 ENCOUNTER — Ambulatory Visit: Payer: Medicaid Other | Attending: Obstetrics and Gynecology

## 2019-11-02 DIAGNOSIS — O99333 Smoking (tobacco) complicating pregnancy, third trimester: Secondary | ICD-10-CM | POA: Diagnosis not present

## 2019-11-02 DIAGNOSIS — O99213 Obesity complicating pregnancy, third trimester: Secondary | ICD-10-CM

## 2019-11-02 DIAGNOSIS — O99210 Obesity complicating pregnancy, unspecified trimester: Secondary | ICD-10-CM

## 2019-11-02 DIAGNOSIS — O4290 Premature rupture of membranes, unspecified as to length of time between rupture and onset of labor, unspecified weeks of gestation: Secondary | ICD-10-CM | POA: Diagnosis not present

## 2019-11-02 DIAGNOSIS — Z3A37 37 weeks gestation of pregnancy: Secondary | ICD-10-CM

## 2019-11-02 DIAGNOSIS — O34219 Maternal care for unspecified type scar from previous cesarean delivery: Secondary | ICD-10-CM | POA: Diagnosis not present

## 2019-11-02 DIAGNOSIS — O09523 Supervision of elderly multigravida, third trimester: Secondary | ICD-10-CM

## 2019-11-02 DIAGNOSIS — Z362 Encounter for other antenatal screening follow-up: Secondary | ICD-10-CM | POA: Insufficient documentation

## 2019-11-07 ENCOUNTER — Other Ambulatory Visit (HOSPITAL_COMMUNITY)
Admission: RE | Admit: 2019-11-07 | Discharge: 2019-11-07 | Disposition: A | Discharge status: Home / Self Care | Payer: Medicaid Other | Source: Ambulatory Visit | Attending: Advanced Practice Midwife | Admitting: Advanced Practice Midwife

## 2019-11-07 ENCOUNTER — Other Ambulatory Visit: Payer: Self-pay

## 2019-11-07 ENCOUNTER — Ambulatory Visit (INDEPENDENT_AMBULATORY_CARE_PROVIDER_SITE_OTHER): Payer: Medicaid Other | Admitting: Advanced Practice Midwife

## 2019-11-07 VITALS — BP 124/80 | HR 76 | Wt 312.2 lb

## 2019-11-07 DIAGNOSIS — Z3A38 38 weeks gestation of pregnancy: Secondary | ICD-10-CM

## 2019-11-07 DIAGNOSIS — O09529 Supervision of elderly multigravida, unspecified trimester: Secondary | ICD-10-CM

## 2019-11-07 DIAGNOSIS — O34219 Maternal care for unspecified type scar from previous cesarean delivery: Secondary | ICD-10-CM

## 2019-11-07 DIAGNOSIS — O3443 Maternal care for other abnormalities of cervix, third trimester: Secondary | ICD-10-CM

## 2019-11-07 DIAGNOSIS — O42919 Preterm premature rupture of membranes, unspecified as to length of time between rupture and onset of labor, unspecified trimester: Secondary | ICD-10-CM

## 2019-11-07 DIAGNOSIS — Z9889 Other specified postprocedural states: Secondary | ICD-10-CM

## 2019-11-07 NOTE — Progress Notes (Signed)
Pt is here for ROB, [redacted]w[redacted]d. Pt would like to discuss contraceptive options, she is declining the BTL at this time.

## 2019-11-07 NOTE — Patient Instructions (Signed)
Things to Try After 37 weeks to Encourage Labor/Get Ready for Labor:   1.  Try the [employer] at [email or web address].[email or web address] daily to improve baby's position and encourage the onset of labor.  2. Walk a little and rest a little every day.  Change positions often.  3. Cervical Ripening: May try one or both a. Red Raspberry Leaf capsules or tea:  two 300mg  or 400mg  tablets with each meal, 2-3 times a day, or 1-3 cups of tea daily  Potential Side Effects Of Raspberry Leaf:  Most women do not experience any side effects from drinking raspberry leaf tea. However, nausea and loose stools are possible   b. (Do not use Evening Primrose Oil with a previous cesarean section) Evening Primrose Oil capsules: may take 1 to 3 capsules daily. Take 1-2 capsules by mouth each day and place one capsule vaginally at night.  You may also prick the vaginal capsule to release the oil prior to inserting in the vagina. Some of the potential side effects:  Upset stomach  Loose stools or diarrhea  Headaches  Nausea  4. Sex (and especially sex with orgasm) can also help the cervix ripen and encourage labor onset.

## 2019-11-07 NOTE — Progress Notes (Addendum)
   PRENATAL VISIT NOTE  Subjective:  Teresa Sanchez is a 39 y.o. G3P1011 at [redacted]w[redacted]d being seen today for ongoing prenatal care.  She is currently monitored for the following issues for this high-risk pregnancy and has ASCUS with positive high risk HPV cervical; CIN II (cervical intraepithelial neoplasia II); Encounter for supervision of high risk multigravida of advanced maternal age, antepartum; Smoking (tobacco) complicating pregnancy, first trimester; Maternal obesity affecting pregnancy, antepartum; Previous cesarean delivery affecting pregnancy, antepartum; AMA (advanced maternal age) multigravida 35+; History of LEEP (loop electrosurgical excision procedure) of cervix complicating pregnancy; and Preterm premature rupture of membranes (PPROM) with unknown onset of labor on their problem list.  Patient reports occasional contractions.  Contractions: Irritability. Vag. Bleeding: None.  Movement: Present. Denies leaking of fluid.   The following portions of the patient's history were reviewed and updated as appropriate: allergies, current medications, past family history, past medical history, past social history, past surgical history and problem list.   Objective:   Vitals:   11/07/19 1010  BP: 124/80  Pulse: 76  Weight: (!) 312 lb 3.2 oz (141.6 kg)    Fetal Status: Fetal Heart Rate (bpm): 148   Movement: Present     General:  Alert, oriented and cooperative. Patient is in no acute distress.  Skin: Skin is warm and dry. No rash noted.   Cardiovascular: Normal heart rate noted  Respiratory: Normal respiratory effort, no problems with respiration noted  Abdomen: Soft, gravid, appropriate for gestational age.  Pain/Pressure: Absent     Pelvic: Cervical exam performed in the presence of a chaperone        Extremities: Normal range of motion.  Edema: Trace  Mental Status: Normal mood and affect. Normal behavior. Normal judgment and thought content.   Assessment and Plan:  Pregnancy:  G3P1011 at [redacted]w[redacted]d 1. Encounter for supervision of high risk multigravida of advanced maternal age, antepartum --Anticipatory guidance about next visits/weeks of pregnancy given. --Next visit in 1 week in office  2. Previous cesarean delivery affecting pregnancy, antepartum --Desires TOLAC, consent signed  3. Preterm premature rupture of membranes (PPROM) with unknown onset of labor --? ROM with reseal vs false testing, no recent leaking fluid and AFI wnl on [place]  4. History of loop electrosurgical excision procedure (LEEP) of cervix affecting pregnancy in third trimester  5. [redacted] weeks gestation of pregnancy  Term labor symptoms and general obstetric precautions including but not limited to vaginal bleeding, contractions, leaking of fluid and fetal movement were reviewed in detail with the patient. Please refer to After Visit Summary for other counseling recommendations.   Return in about 1 week (around 11/14/2019).  Future Appointments  Date Time Provider Department Center  11/15/2019  4:00 PM Burleson, Brand Males, NP [facility] None    Sharen Counter, CNM

## 2019-11-08 LAB — CERVICOVAGINAL ANCILLARY ONLY
Chlamydia: NEGATIVE
Comment: NEGATIVE
Comment: NORMAL
Neisseria Gonorrhea: NEGATIVE

## 2019-11-09 ENCOUNTER — Encounter: Payer: Self-pay | Admitting: Advanced Practice Midwife

## 2019-11-09 DIAGNOSIS — O99820 Streptococcus B carrier state complicating pregnancy: Secondary | ICD-10-CM | POA: Insufficient documentation

## 2019-11-09 LAB — STREP GP B NAA: Strep Gp B NAA: POSITIVE — AB

## 2019-11-15 ENCOUNTER — Ambulatory Visit (INDEPENDENT_AMBULATORY_CARE_PROVIDER_SITE_OTHER): Payer: Medicaid Other | Admitting: Nurse Practitioner

## 2019-11-15 ENCOUNTER — Other Ambulatory Visit: Payer: Self-pay

## 2019-11-15 VITALS — BP 126/85 | HR 81 | Wt 315.8 lb

## 2019-11-15 DIAGNOSIS — O09523 Supervision of elderly multigravida, third trimester: Secondary | ICD-10-CM

## 2019-11-15 DIAGNOSIS — Z3A39 39 weeks gestation of pregnancy: Secondary | ICD-10-CM

## 2019-11-15 DIAGNOSIS — O09529 Supervision of elderly multigravida, unspecified trimester: Secondary | ICD-10-CM

## 2019-11-15 DIAGNOSIS — O34219 Maternal care for unspecified type scar from previous cesarean delivery: Secondary | ICD-10-CM

## 2019-11-15 NOTE — Progress Notes (Signed)
    Subjective:  Teresa Sanchez is a 39 y.o. G3P1011 at [redacted]w[redacted]d being seen today for ongoing prenatal care.  She is currently monitored for the following issues for this high-risk pregnancy and has ASCUS with positive high risk HPV cervical; CIN II (cervical intraepithelial neoplasia II); Encounter for supervision of high risk multigravida of advanced maternal age, antepartum; Smoking (tobacco) complicating pregnancy, first trimester; Maternal obesity affecting pregnancy, antepartum; Previous cesarean delivery affecting pregnancy, antepartum; AMA (advanced maternal age) multigravida 35+; History of LEEP (loop electrosurgical excision procedure) of cervix complicating pregnancy; Preterm premature rupture of membranes (PPROM) with unknown onset of labor; and GBS (group B Streptococcus carrier), +RV culture, currently pregnant on their problem list.  Patient reports no complaints.  Contractions: Irregular. Vag. Bleeding: None.  Movement: Present. Denies leaking of fluid.   The following portions of the patient's history were reviewed and updated as appropriate: allergies, current medications, past family history, past medical history, past social history, past surgical history and problem list. Problem list updated.  Objective:   Vitals:   11/15/19 1614  BP: 126/85  Pulse: 81  Weight: (!) 315 lb 12.8 oz (143.2 kg)    Fetal Status: Fetal Heart Rate (bpm): 154 Fundal Height: 39 cm Movement: Present     General:  Alert, oriented and cooperative. Patient is in no acute distress.  Skin: Skin is warm and dry. No rash noted.   Cardiovascular: Normal heart rate noted  Respiratory: Normal respiratory effort, no problems with respiration noted  Abdomen: Soft, gravid, appropriate for gestational age. Pain/Pressure: Absent     Pelvic:  Cervical exam deferred        Extremities: Normal range of motion.  Edema: None  Mental Status: Normal mood and affect. Normal behavior. Normal judgment and thought  content.   Urinalysis:      Assessment and Plan:  Pregnancy: G3P1011 at [redacted]w[redacted]d  1. Encounter for supervision of high risk multigravida of advanced maternal age, antepartum Induction planned at 59 weeks Wants to use midwife tea with lemon verbena and castor oil.  Advised it will cause abdominal cramping and BM - client to decide if she wants to pursue - doing [employer] and raspberry leaf tea now - contractions are irregular. Agrees to induction at 41 weeks (had initially wanted induction at 42 weeks).  Discussed placental aging that can occur and BPP will be ordered as she will be postdates. Baby is moving well and advised movement needs to be daily as it has been being.  2. Previous cesarean delivery affecting pregnancy, antepartum VBAC papers on file  3. Multigravida of advanced maternal age in third trimester   Term labor symptoms and general obstetric precautions including but not limited to vaginal bleeding, contractions, leaking of fluid and fetal movement were reviewed in detail with the patient. Please refer to After Visit Summary for other counseling recommendations.  Return in about 1 week (around 11/22/2019) for in person ROB.  Nolene Bernheim, RN, MSN, NP-BC Nurse Practitioner, [facility] for [employer], [facility] 11/15/2019 4:55 PM

## 2019-11-20 ENCOUNTER — Other Ambulatory Visit (HOSPITAL_COMMUNITY): Payer: Self-pay | Admitting: Advanced Practice Midwife

## 2019-11-21 ENCOUNTER — Other Ambulatory Visit: Payer: Self-pay

## 2019-11-21 ENCOUNTER — Ambulatory Visit: Payer: Medicaid Other | Admitting: *Deleted

## 2019-11-21 ENCOUNTER — Ambulatory Visit: Payer: Medicaid Other | Attending: Nurse Practitioner

## 2019-11-21 ENCOUNTER — Encounter: Payer: Self-pay | Admitting: *Deleted

## 2019-11-21 DIAGNOSIS — F17200 Nicotine dependence, unspecified, uncomplicated: Secondary | ICD-10-CM

## 2019-11-21 DIAGNOSIS — E669 Obesity, unspecified: Secondary | ICD-10-CM

## 2019-11-21 DIAGNOSIS — Z3A39 39 weeks gestation of pregnancy: Secondary | ICD-10-CM

## 2019-11-21 DIAGNOSIS — O34219 Maternal care for unspecified type scar from previous cesarean delivery: Secondary | ICD-10-CM | POA: Insufficient documentation

## 2019-11-21 DIAGNOSIS — O99820 Streptococcus B carrier state complicating pregnancy: Secondary | ICD-10-CM

## 2019-11-21 DIAGNOSIS — O09523 Supervision of elderly multigravida, third trimester: Secondary | ICD-10-CM

## 2019-11-21 DIAGNOSIS — O99333 Smoking (tobacco) complicating pregnancy, third trimester: Secondary | ICD-10-CM

## 2019-11-21 DIAGNOSIS — Z362 Encounter for other antenatal screening follow-up: Secondary | ICD-10-CM

## 2019-11-21 DIAGNOSIS — O99210 Obesity complicating pregnancy, unspecified trimester: Secondary | ICD-10-CM | POA: Insufficient documentation

## 2019-11-21 DIAGNOSIS — O99213 Obesity complicating pregnancy, third trimester: Secondary | ICD-10-CM

## 2019-11-21 DIAGNOSIS — O09529 Supervision of elderly multigravida, unspecified trimester: Secondary | ICD-10-CM | POA: Insufficient documentation

## 2019-11-21 DIAGNOSIS — O4290 Premature rupture of membranes, unspecified as to length of time between rupture and onset of labor, unspecified weeks of gestation: Secondary | ICD-10-CM | POA: Diagnosis not present

## 2019-11-21 DIAGNOSIS — Z6841 Body Mass Index (BMI) 40.0 and over, adult: Secondary | ICD-10-CM

## 2019-11-22 ENCOUNTER — Ambulatory Visit (INDEPENDENT_AMBULATORY_CARE_PROVIDER_SITE_OTHER): Payer: Medicaid Other | Admitting: Advanced Practice Midwife

## 2019-11-22 ENCOUNTER — Encounter: Payer: Self-pay | Admitting: Advanced Practice Midwife

## 2019-11-22 VITALS — BP 129/86 | HR 82 | Wt 313.0 lb

## 2019-11-22 DIAGNOSIS — Z3A40 40 weeks gestation of pregnancy: Secondary | ICD-10-CM

## 2019-11-22 DIAGNOSIS — Z3480 Encounter for supervision of other normal pregnancy, unspecified trimester: Secondary | ICD-10-CM

## 2019-11-22 NOTE — Progress Notes (Signed)
ROB   CC: Spotting on yesterday pt request cervix check today. Also, notes pressure.

## 2019-11-22 NOTE — Patient Instructions (Signed)
COVID-19 Vaccination if You Are Pregnant or Breastfeeding ° °The Society for Maternal-Fetal Medicine (SMFM) and other pregnancy experts recommend that pregnant and lactating people be vaccinated against COVID-19. The Centers for Disease Control and Prevention (CDC) also recommend vaccination for “all people aged 39 years and older, including people who are pregnant, breastfeeding, trying to get pregnant now, or might become pregnant in the future.” Vaccination is the best way to reduce the risks of COVID-19 infection and COVID-related complications for both you and your baby. ° °Three vaccines are available to prevent COVID-19: °• The two-dose Pfizer vaccine for people 12 years and older--APPROVED by the [place] Food and Drug Administration on November 20, 2019 °• The two-dose Moderna vaccine for people 18 years and older--AUTHORIZED for emergency use °• The one-dose Johnson & Johnson vaccine for people 18 years and older (you may also see this vaccine referred to as the “Janssen vaccine”)--AUTHORIZED for emergency use ° °For those receiving the Pfizer and Moderna vaccines, the second dose is given 21 days (Pfizer) and 28 days (Moderna) after the first dose. The Johnson & Johnson vaccine is only one dose. ° °Information for Pregnant Individuals °If you are pregnant or planning to become pregnant and are thinking about getting vaccinated, consider talking with your health care professional about the vaccine.  ° °To help with your decision, you should consider the following key points: °Anyone can get the COVID vaccines free of charge regardless of immigration status or whether they have insurance. You may be asked for your social security number, but it is  °NOT required to get vaccinated. ° °What are benefits of getting the COVID-19 vaccines during pregnancy?  °• The vaccines can help protect you from getting COVID-19. With the two-dose vaccines, you must get both doses for maximum effectiveness. It’s not yet known how  long protection lasts. ° °• Another potential benefit is that getting the vaccine while pregnant may help you pass antiCOVID-19 antibodies to your baby. In numerous studies of vaccinated moms, antibodies were found in the umbilical cord blood of babies and in the mother’s breastmilk. ° °• The CDC, along with other federal partners, are monitoring people who have been vaccinated for serious side effects. So far, more than 139,000 pregnant people have been °vaccinated. No unexpected pregnancy or fetal problems have occurred. There have been no reports of any increased risk of pregnancy loss, growth problems, or birth defects. ° °• A safe vaccine is generally considered one in which the benefits of being vaccinated outweigh the risks. The current vaccines are not live vaccines. There is only a very small  °chance that they cross the placenta, so it’s unlikely that they even reach the fetus. Vaccines don’t affect future fertility. The only people who should NOT get vaccinated are those who have had a severe allergic reaction to vaccines in the past or any vaccine ingredients. ° °• Side effects may occur in the first 3 days after getting vaccinated.1 These include mild to moderate fever, headache, and muscle aches. Side effects may be worse after the second dose of the Pfizer and Moderna vaccines. Fever should be avoided during pregnancy,especially in the first trimester. Those who develop a fever after vaccination can take °acetaminophen (Tylenol). This medication is safe to use during pregnancy and does not  affect how the vaccine works.  ° °What are the known risks of getting COVID-19 during pregnancy?  °About 1 to 3 per 1,000 pregnant women with COVID-19 will develop severe disease. Compared with those who   aren’t pregnant, pregnant people infected by the COVID-19 virus: °• Are 3 times more likely to need ICU care °• Are 2 to 3 times more likely to need advanced life support and a breathing tube  °• Have a small  increased risk of dying due to COVID-19 °They may also be at increased risk of stillbirth and preterm birth. ° °What is my risk of getting COVID-19?  °Your risk of getting COVID-19 depends on the chance that you will come into contact with another infected person. The risk may be higher if you live in a community where there is a lot of COVID-19 infection or work in healthcare or another high-contact setting.  ° °What is my risk for severe complications if I get COVID-19?  °Data show that older pregnant women; those with preexisting health conditions, such as a body mass index higher than 35 kg/m2, diabetes, and heart disorders; and Black or Latinx women have an especially increased risk of severe disease and death from COVID-19. °  °If you still have questions about the vaccines or need more information, ask your health care provider or go to the Centers for Disease Control and Prevention’s COVID-19 vaccine webpage.  ° °An Update on the Johnson & Johnson Vaccine  ° °In April 2021, the FDA and CDC called for a brief pause to use of the Johnson & Johnson vaccine. They did so after reports of a severe side effect in a very small number of women younger than age 50 following vaccination. This side effect, called thrombosis with thrombocytopenia syndrome (TTS), causes blood clots (thrombosis) combined with low levels of platelets (thrombocytopenia). ° °TTS following the Johnson & Johnson vaccine is extremely rare. At the time of this update, it has occurred in only 7 people per 1 million Johnson & Johnson shots given. According to the CDC, being on hormonal birth control (the pill, patch, or ring), pregnancy, breastfeeding, or being recently pregnant does not make you more likely to develop TTS after getting the Johnson & Johnson vaccine. The pause was lifted on July 21, 2019, after the FDA and CDC determined that the known benefits of the Johnson & Johnson vaccine far outweigh the risks.  ° °Health care professionals  have been alerted to the possibility of this side effect in people who have received the Johnson & Johnson vaccine. National organizations continue to recommend COVID-19 vaccination with any of the vaccines for pregnant women. All women younger than age 50 years, whether pregnant, breastfeeding, or not, should be aware of the very rare risk of TTS after getting the Johnson & Johnson vaccine. The Pfizer and Moderna vaccines don’t have this risk. If you get the Johnson & Johnson vaccine,  °seek medical help right away if you develop any of the following symptoms within 3 weeks of getting your shot: ° °• Severe or persistent headaches or blurred vision °• Shortness of breath °• Chest pain °• Leg swelling °• Persistent abdominal pain °• Easy bruising or tiny blood spots under the skin beyond the injection site ° °Experts continue to collect health and safety information from pregnant people who have been vaccinated. If you have questions about vaccination during pregnancy, visit the CDC website or talk to your health care professional. Information for Breastfeeding/Lactating Individuals The Society for Maternal-Fetal Medicine and other pregnancy experts recommend COVID-19 vaccination for people who are breastfeeding/lactating. You don’t have to delay or stop  °breastfeeding just because you get vaccinated.  ° °Getting Vaccinated  °You can get vaccinated at   any time during pregnancy. The CDC is committed to monitoring the vaccine’s safety for all individuals. Your health professional or vaccine clinic may give you information about enrolling in the v-safe after vaccination health checker (see the box below).Even after you’re fully vaccinated, it is important to follow the CDC’s guidance for wearing a mask indoors in areas where there are substantial or high rates of COVID-19 infection.  ° °What Happens When You Enroll in v-Safe?  °The v-safe after vaccination health checker program lets the CDC check in with you after  your vaccination. At sign-up, you can indicate that you are pregnant. Once you do that, expect the following: °• Someone may call you from the v-safe program to ask initial questions and get more information. °• You may be asked to enroll in the vaccine pregnancy registry, which is collecting information about any effects of the vaccine during pregnancy. This is a great way to help scientists monitor the vaccine’s safety and effectiveness.  ° °References °1. Oliver SE, Gargano JW, Marin M, Wallace M, Curran KG, Chamberland M, et al. The Advisory Committee on Immunization Practices’ Interim Recommendation for Use of Pfizer-BioNTech  °COVID-19 Vaccine -- [place], December 2020. MMWR Morbidity and Mortality Weekly Report 2020;69. °2. FDA Briefing Document. Janssen Ad26.COV2.S Vaccine for the Prevention of COVID-19. 2021. Accessed Jun 02, 2019; Available from: https://www.fda.gov/media/146217/download °3. PFIZER-BIONTECH COVID-19 VACCINE [package insert] [place]: Pfizer and [place], [place]: Biontech;2020. °4. FDA Briefing Document. Moderna COVID-19 Vaccine. 2020. Accessed 2020, Dec 18; Available from: https://www.fda.gov/media/144434/download  °5. Gray KJ, Bordt EA, Atyeo C, Deriso E, Akinwunmi B, Young N, et al. COVID-19 vaccine response in pregnant and lactating women: a cohort study. Am J Obstet Gynecol 2021 Mar 24. °6. Panagiotakopoulos L, Myers TR, Gee J, Lipkind HS, Kharbanda EO, Ryan DS, et al. SARS-CoV-2 Infection Among Hospitalized Pregnant Women: Reasons for Admission and Pregnancy  °Characteristics - [facility], March 1-Aug 27, 2018. MMWR Morb Mortal Wkly Rep 2020 Sep 23;69(38):1355-9. °7. Zambrano LD, Ellington S, Strid P, Galang RR, Oduyebo T, Tong VT, et al. Update: Characteristics of Symptomatic Women of Reproductive Age with Laboratory-Confirmed SARSCoV-2 Infection by Pregnancy Status - [place], January 22-December 31, 2018. MMWR Morb Mortal Wkly Rep 2020 Nov  6;69(44):1641-7. °8. Delahoy MJ, Whitaker M, O'Halloran A, Chai SJ, Kirley PD, Alden N, et al. Characteristics and Maternal and Birth Outcomes of Hospitalized Pregnant Women with Laboratory-Confirmed COVID-19 - COVID-NET, 13 [place], March 1-November 19, 2018. MMWR Morb Mortal Wkly Rep 2020 Sep 25;69(38):1347-54. ° °

## 2019-11-22 NOTE — Progress Notes (Signed)
   PRENATAL VISIT NOTE  Subjective:  Teresa Sanchez is a 39 y.o. G3P1011 at [redacted]w[redacted]d being seen today for ongoing prenatal care.  She is currently monitored for the following issues for this high-risk pregnancy and has ASCUS with positive high risk HPV cervical; CIN II (cervical intraepithelial neoplasia II); Encounter for supervision of high risk multigravida of advanced maternal age, antepartum; Smoking (tobacco) complicating pregnancy, first trimester; Maternal obesity affecting pregnancy, antepartum; Previous cesarean delivery affecting pregnancy, antepartum; AMA (advanced maternal age) multigravida 35+; History of LEEP (loop electrosurgical excision procedure) of cervix complicating pregnancy; Preterm premature rupture of membranes (PPROM) with unknown onset of labor; and GBS (group B Streptococcus carrier), +RV culture, currently pregnant on their problem list.  Patient reports no complaints.  Contractions: Irritability. Vag. Bleeding: Bloody Show.  Movement: Present. Denies leaking of fluid.   The following portions of the patient's history were reviewed and updated as appropriate: allergies, current medications, past family history, past medical history, past social history, past surgical history and problem list.   Objective:   Vitals:   11/22/19 1342  BP: 129/86  Pulse: 82  Weight: (!) 313 lb (142 kg)    Fetal Status: Fetal Heart Rate (bpm): 144   Movement: Present     General:  Alert, oriented and cooperative. Patient is in no acute distress.  Skin: Skin is warm and dry. No rash noted.   Cardiovascular: Normal heart rate noted  Respiratory: Normal respiratory effort, no problems with respiration noted  Abdomen: Soft, gravid, appropriate for gestational age.  Pain/Pressure: Present     Pelvic: Cervical exam performed in the presence of a chaperone Dilation: Fingertip Effacement (%): Thick Station: Ballotable  Extremities: Normal range of motion.  Edema: Trace  Mental Status:  Normal mood and affect. Normal behavior. Normal judgment and thought content.   Assessment and Plan:  Pregnancy: G3P1011 at [redacted]w[redacted]d 1. Supervision of other normal pregnancy, antepartum   2. [redacted] weeks gestation of pregnancy - patient had BPP yesterday - Has IOL scheduled for 11/29/2019   Term labor symptoms and general obstetric precautions including but not limited to vaginal bleeding, contractions, leaking of fluid and fetal movement were reviewed in detail with the patient. Please refer to After Visit Summary for other counseling recommendations.   No follow-ups on file.  Future Appointments  Date Time Provider Department Center  11/29/2019  8:00 AM [facility] ROOM [facility] None    Thressa Sheller DNP, CNM  11/22/19  2:50 PM

## 2019-11-23 ENCOUNTER — Encounter (HOSPITAL_COMMUNITY): Payer: Self-pay | Admitting: *Deleted

## 2019-11-23 ENCOUNTER — Telehealth (HOSPITAL_COMMUNITY): Payer: Self-pay | Admitting: *Deleted

## 2019-11-23 NOTE — Telephone Encounter (Signed)
Preadmission screen  

## 2019-11-27 ENCOUNTER — Inpatient Hospital Stay (HOSPITAL_COMMUNITY): Payer: Medicaid Other | Admitting: Certified Registered Nurse Anesthetist

## 2019-11-27 ENCOUNTER — Encounter (HOSPITAL_COMMUNITY): Payer: Self-pay | Admitting: Obstetrics and Gynecology

## 2019-11-27 ENCOUNTER — Inpatient Hospital Stay (HOSPITAL_COMMUNITY)
Admission: AD | Admit: 2019-11-27 | Discharge: 2019-11-30 | DRG: 788 | Disposition: A | Discharge status: Home / Self Care | Payer: Medicaid Other | Attending: Obstetrics and Gynecology | Admitting: Obstetrics and Gynecology

## 2019-11-27 ENCOUNTER — Other Ambulatory Visit (HOSPITAL_COMMUNITY)
Admission: RE | Admit: 2019-11-27 | Discharge: 2019-11-27 | Disposition: A | Discharge status: Home / Self Care | Payer: Medicaid Other | Source: Ambulatory Visit | Attending: Family Medicine | Admitting: Family Medicine

## 2019-11-27 ENCOUNTER — Other Ambulatory Visit: Payer: Self-pay

## 2019-11-27 ENCOUNTER — Encounter (HOSPITAL_COMMUNITY)
Admission: AD | Disposition: A | Discharge status: Home / Self Care | Payer: Self-pay | Source: Home / Self Care | Attending: Obstetrics and Gynecology

## 2019-11-27 DIAGNOSIS — O34211 Maternal care for low transverse scar from previous cesarean delivery: Secondary | ICD-10-CM | POA: Diagnosis present

## 2019-11-27 DIAGNOSIS — Z87891 Personal history of nicotine dependence: Secondary | ICD-10-CM

## 2019-11-27 DIAGNOSIS — O99214 Obesity complicating childbirth: Secondary | ICD-10-CM | POA: Diagnosis present

## 2019-11-27 DIAGNOSIS — O368130 Decreased fetal movements, third trimester, not applicable or unspecified: Secondary | ICD-10-CM | POA: Diagnosis not present

## 2019-11-27 DIAGNOSIS — O480 Post-term pregnancy: Secondary | ICD-10-CM | POA: Diagnosis present

## 2019-11-27 DIAGNOSIS — O34219 Maternal care for unspecified type scar from previous cesarean delivery: Secondary | ICD-10-CM | POA: Diagnosis present

## 2019-11-27 DIAGNOSIS — Z01812 Encounter for preprocedural laboratory examination: Secondary | ICD-10-CM | POA: Insufficient documentation

## 2019-11-27 DIAGNOSIS — Z20822 Contact with and (suspected) exposure to covid-19: Secondary | ICD-10-CM | POA: Diagnosis not present

## 2019-11-27 DIAGNOSIS — O09529 Supervision of elderly multigravida, unspecified trimester: Secondary | ICD-10-CM

## 2019-11-27 DIAGNOSIS — Z3A40 40 weeks gestation of pregnancy: Secondary | ICD-10-CM

## 2019-11-27 DIAGNOSIS — O99824 Streptococcus B carrier state complicating childbirth: Secondary | ICD-10-CM | POA: Diagnosis not present

## 2019-11-27 DIAGNOSIS — O42919 Preterm premature rupture of membranes, unspecified as to length of time between rupture and onset of labor, unspecified trimester: Secondary | ICD-10-CM | POA: Diagnosis present

## 2019-11-27 DIAGNOSIS — Z3A00 Weeks of gestation of pregnancy not specified: Secondary | ICD-10-CM | POA: Diagnosis not present

## 2019-11-27 DIAGNOSIS — O99210 Obesity complicating pregnancy, unspecified trimester: Secondary | ICD-10-CM | POA: Diagnosis present

## 2019-11-27 DIAGNOSIS — O99820 Streptococcus B carrier state complicating pregnancy: Secondary | ICD-10-CM

## 2019-11-27 LAB — CBC
HCT: 34.5 % — ABNORMAL LOW (ref 36.0–46.0)
Hemoglobin: 11 g/dL — ABNORMAL LOW (ref 12.0–15.0)
MCH: 30.8 pg (ref 26.0–34.0)
MCHC: 31.9 g/dL (ref 30.0–36.0)
MCV: 96.6 fL (ref 80.0–100.0)
Platelets: 286 10*3/uL (ref 150–400)
RBC: 3.57 MIL/uL — ABNORMAL LOW (ref 3.87–5.11)
RDW: 12.6 % (ref 11.5–15.5)
WBC: 13.5 10*3/uL — ABNORMAL HIGH (ref 4.0–10.5)
nRBC: 0 % (ref 0.0–0.2)

## 2019-11-27 LAB — TYPE AND SCREEN
ABO/RH(D): A POS
Antibody Screen: NEGATIVE

## 2019-11-27 LAB — SARS CORONAVIRUS 2 (TAT 6-24 HRS): SARS Coronavirus 2: NEGATIVE

## 2019-11-27 SURGERY — Surgical Case
Anesthesia: Spinal

## 2019-11-27 MED ORDER — OXYTOCIN-SODIUM CHLORIDE 30-0.9 UT/500ML-% IV SOLN: 2.5000 [IU]/h | INTRAVENOUS | Status: DC

## 2019-11-27 MED ORDER — TERBUTALINE SULFATE 1 MG/ML IJ SOLN
0.2500 mg | Freq: Once | INTRAMUSCULAR | Status: AC | PRN
  Administered 2019-11-27: 0.25 mg via SUBCUTANEOUS
  Filled 2019-11-27: qty 1

## 2019-11-27 MED ORDER — SODIUM CHLORIDE 0.9 % IV SOLN
INTRAVENOUS | Status: DC | PRN
  Administered 2019-11-27: 500 mg via INTRAVENOUS

## 2019-11-27 MED ORDER — DIPHENHYDRAMINE HCL 25 MG PO CAPS: 25.0000 mg | ORAL_CAPSULE | Freq: Four times a day (QID) | ORAL | Status: DC | PRN

## 2019-11-27 MED ORDER — SOD CITRATE-CITRIC ACID 500-334 MG/5ML PO SOLN
30.0000 mL | ORAL | Status: DC | PRN
  Filled 2019-11-27: qty 30

## 2019-11-27 MED ORDER — PROPOFOL 10 MG/ML IV BOLUS
INTRAVENOUS | Status: DC | PRN
  Administered 2019-11-27: 200 mg via INTRAVENOUS

## 2019-11-27 MED ORDER — SCOPOLAMINE 1 MG/3DAYS TD PT72: 1.0000 | MEDICATED_PATCH | TRANSDERMAL | Status: DC

## 2019-11-27 MED ORDER — ONDANSETRON HCL 4 MG/2ML IJ SOLN
INTRAMUSCULAR | Status: AC
  Filled 2019-11-27: qty 2

## 2019-11-27 MED ORDER — DEXTROSE 5 % IV SOLN
INTRAVENOUS | Status: AC
  Filled 2019-11-27: qty 3000

## 2019-11-27 MED ORDER — TETANUS-DIPHTH-ACELL PERTUSSIS 5-2.5-18.5 LF-MCG/0.5 IM SUSP: 0.5000 mL | Freq: Once | INTRAMUSCULAR | Status: DC

## 2019-11-27 MED ORDER — DEXAMETHASONE SODIUM PHOSPHATE 10 MG/ML IJ SOLN
INTRAMUSCULAR | Status: DC | PRN
  Administered 2019-11-27: 10 mg via INTRAVENOUS

## 2019-11-27 MED ORDER — LACTATED RINGERS IV SOLN
500.0000 mL | INTRAVENOUS | Status: DC | PRN
  Administered 2019-11-27: 1000 mL via INTRAVENOUS

## 2019-11-27 MED ORDER — OXYCODONE-ACETAMINOPHEN 5-325 MG PO TABS: 1.0000 | ORAL_TABLET | ORAL | Status: DC | PRN

## 2019-11-27 MED ORDER — MENTHOL 3 MG MT LOZG
1.0000 | LOZENGE | OROMUCOSAL | Status: DC | PRN
  Filled 2019-11-27: qty 9

## 2019-11-27 MED ORDER — KETOROLAC TROMETHAMINE 30 MG/ML IJ SOLN
30.0000 mg | Freq: Four times a day (QID) | INTRAMUSCULAR | Status: AC | PRN
  Administered 2019-11-27: 30 mg via INTRAVENOUS

## 2019-11-27 MED ORDER — OXYCODONE-ACETAMINOPHEN 5-325 MG PO TABS: 2.0000 | ORAL_TABLET | ORAL | Status: DC | PRN

## 2019-11-27 MED ORDER — OXYTOCIN-SODIUM CHLORIDE 30-0.9 UT/500ML-% IV SOLN: 2.5000 [IU]/h | INTRAVENOUS | Status: AC

## 2019-11-27 MED ORDER — SODIUM CHLORIDE 0.9 % IV SOLN: INTRAVENOUS | Status: DC | PRN

## 2019-11-27 MED ORDER — DEXAMETHASONE SODIUM PHOSPHATE 10 MG/ML IJ SOLN
INTRAMUSCULAR | Status: AC
  Filled 2019-11-27: qty 1

## 2019-11-27 MED ORDER — KETOROLAC TROMETHAMINE 30 MG/ML IJ SOLN
INTRAMUSCULAR | Status: AC
  Filled 2019-11-27: qty 1

## 2019-11-27 MED ORDER — ACETAMINOPHEN 500 MG PO TABS
1000.0000 mg | ORAL_TABLET | Freq: Three times a day (TID) | ORAL | Status: DC
  Administered 2019-11-28 – 2019-11-30 (×6): 1000 mg via ORAL
  Filled 2019-11-27 (×6): qty 2

## 2019-11-27 MED ORDER — ACETAMINOPHEN 325 MG PO TABS: 650.0000 mg | ORAL_TABLET | ORAL | Status: DC | PRN

## 2019-11-27 MED ORDER — SIMETHICONE 80 MG PO CHEW
80.0000 mg | CHEWABLE_TABLET | Freq: Three times a day (TID) | ORAL | Status: DC
  Administered 2019-11-28 – 2019-11-30 (×7): 80 mg via ORAL
  Filled 2019-11-27 (×8): qty 1

## 2019-11-27 MED ORDER — KETOROLAC TROMETHAMINE 30 MG/ML IJ SOLN: 30.0000 mg | Freq: Four times a day (QID) | INTRAMUSCULAR | Status: AC | PRN

## 2019-11-27 MED ORDER — SCOPOLAMINE 1 MG/3DAYS TD PT72: 1.0000 | MEDICATED_PATCH | Freq: Once | TRANSDERMAL | Status: DC

## 2019-11-27 MED ORDER — MIDAZOLAM HCL 2 MG/2ML IJ SOLN
INTRAMUSCULAR | Status: AC
  Filled 2019-11-27: qty 2

## 2019-11-27 MED ORDER — PHENYLEPHRINE HCL-NACL 20-0.9 MG/250ML-% IV SOLN
INTRAVENOUS | Status: AC
  Filled 2019-11-27: qty 250

## 2019-11-27 MED ORDER — ONDANSETRON HCL 4 MG/2ML IJ SOLN: 4.0000 mg | Freq: Four times a day (QID) | INTRAMUSCULAR | Status: DC | PRN

## 2019-11-27 MED ORDER — LACTATED RINGERS IV SOLN: INTRAVENOUS | Status: DC

## 2019-11-27 MED ORDER — OXYTOCIN-SODIUM CHLORIDE 30-0.9 UT/500ML-% IV SOLN: 1.0000 m[IU]/min | INTRAVENOUS | Status: DC

## 2019-11-27 MED ORDER — SODIUM CHLORIDE 0.9 % IV SOLN
INTRAVENOUS | Status: AC
  Filled 2019-11-27: qty 500

## 2019-11-27 MED ORDER — PENICILLIN G POT IN DEXTROSE 60000 UNIT/ML IV SOLN
3.0000 10*6.[IU] | INTRAVENOUS | Status: DC
  Filled 2019-11-27 (×2): qty 50

## 2019-11-27 MED ORDER — MORPHINE SULFATE (PF) 0.5 MG/ML IJ SOLN
INTRAMUSCULAR | Status: AC
  Filled 2019-11-27: qty 10

## 2019-11-27 MED ORDER — FENTANYL CITRATE (PF) 100 MCG/2ML IJ SOLN
INTRAMUSCULAR | Status: AC
  Filled 2019-11-27: qty 2

## 2019-11-27 MED ORDER — SOD CITRATE-CITRIC ACID 500-334 MG/5ML PO SOLN
30.0000 mL | ORAL | Status: AC
  Administered 2019-11-27: 30 mL via ORAL

## 2019-11-27 MED ORDER — PROMETHAZINE HCL 25 MG/ML IJ SOLN: 6.2500 mg | INTRAMUSCULAR | Status: DC | PRN

## 2019-11-27 MED ORDER — OXYTOCIN BOLUS FROM INFUSION: 333.0000 mL | Freq: Once | INTRAVENOUS | Status: DC

## 2019-11-27 MED ORDER — ONDANSETRON HCL 4 MG/2ML IJ SOLN: 4.0000 mg | Freq: Three times a day (TID) | INTRAMUSCULAR | Status: DC | PRN

## 2019-11-27 MED ORDER — DIBUCAINE (PERIANAL) 1 % EX OINT: 1.0000 "application " | TOPICAL_OINTMENT | CUTANEOUS | Status: DC | PRN

## 2019-11-27 MED ORDER — ENOXAPARIN SODIUM 60 MG/0.6ML ~~LOC~~ SOLN
60.0000 mg | SUBCUTANEOUS | Status: DC
  Administered 2019-11-28 – 2019-11-30 (×3): 60 mg via SUBCUTANEOUS
  Filled 2019-11-27 (×3): qty 0.6

## 2019-11-27 MED ORDER — LACTATED RINGERS BOLUS PEDS: 500.0000 mL | Freq: Once | INTRAVENOUS | Status: DC

## 2019-11-27 MED ORDER — FENTANYL CITRATE (PF) 250 MCG/5ML IJ SOLN
INTRAMUSCULAR | Status: AC
  Filled 2019-11-27: qty 5

## 2019-11-27 MED ORDER — SODIUM CHLORIDE 0.9 % IR SOLN
Status: DC | PRN
  Administered 2019-11-27: 1

## 2019-11-27 MED ORDER — PHENYLEPHRINE HCL-NACL 20-0.9 MG/250ML-% IV SOLN
INTRAVENOUS | Status: DC | PRN
  Administered 2019-11-27: 60 ug/min via INTRAVENOUS

## 2019-11-27 MED ORDER — DEXTROSE 5 % IV SOLN
3.0000 g | INTRAVENOUS | Status: DC
  Filled 2019-11-27 (×2): qty 3000

## 2019-11-27 MED ORDER — PROPOFOL 10 MG/ML IV BOLUS
INTRAVENOUS | Status: AC
  Filled 2019-11-27: qty 40

## 2019-11-27 MED ORDER — SODIUM CHLORIDE 0.9 % IV SOLN
5.0000 10*6.[IU] | Freq: Once | INTRAVENOUS | Status: DC
  Administered 2019-11-27: 5 10*6.[IU] via INTRAVENOUS
  Filled 2019-11-27: qty 5

## 2019-11-27 MED ORDER — MORPHINE SULFATE (PF) 0.5 MG/ML IJ SOLN
INTRAMUSCULAR | Status: DC | PRN
  Administered 2019-11-27: 5 mg via INTRAVENOUS

## 2019-11-27 MED ORDER — CEFAZOLIN SODIUM-DEXTROSE 1-4 GM/50ML-% IV SOLN
INTRAVENOUS | Status: DC | PRN
  Administered 2019-11-27: 3 g via INTRAVENOUS

## 2019-11-27 MED ORDER — COCONUT OIL OIL
1.0000 "application " | TOPICAL_OIL | Status: DC | PRN
  Administered 2019-11-29: 1 via TOPICAL

## 2019-11-27 MED ORDER — KETOROLAC TROMETHAMINE 30 MG/ML IJ SOLN
30.0000 mg | Freq: Four times a day (QID) | INTRAMUSCULAR | Status: AC
  Administered 2019-11-28 (×4): 30 mg via INTRAVENOUS
  Filled 2019-11-27 (×4): qty 1

## 2019-11-27 MED ORDER — MIDAZOLAM HCL 2 MG/2ML IJ SOLN
INTRAMUSCULAR | Status: DC | PRN
  Administered 2019-11-27: 2 mg via INTRAVENOUS

## 2019-11-27 MED ORDER — WITCH HAZEL-GLYCERIN EX PADS: 1.0000 "application " | MEDICATED_PAD | CUTANEOUS | Status: DC | PRN

## 2019-11-27 MED ORDER — PRENATAL MULTIVITAMIN CH
1.0000 | ORAL_TABLET | Freq: Every day | ORAL | Status: DC
  Administered 2019-11-28 – 2019-11-30 (×3): 1 via ORAL
  Filled 2019-11-27 (×3): qty 1

## 2019-11-27 MED ORDER — ACETAMINOPHEN 500 MG PO TABS
1000.0000 mg | ORAL_TABLET | Freq: Four times a day (QID) | ORAL | Status: AC
  Administered 2019-11-28 (×3): 1000 mg via ORAL
  Filled 2019-11-27 (×3): qty 2

## 2019-11-27 MED ORDER — LIDOCAINE HCL (PF) 1 % IJ SOLN: 30.0000 mL | INTRAMUSCULAR | Status: DC | PRN

## 2019-11-27 MED ORDER — MEPERIDINE HCL 25 MG/ML IJ SOLN: 6.2500 mg | INTRAMUSCULAR | Status: DC | PRN

## 2019-11-27 MED ORDER — ONDANSETRON HCL 4 MG/2ML IJ SOLN
INTRAMUSCULAR | Status: DC | PRN
  Administered 2019-11-27: 4 mg via INTRAVENOUS

## 2019-11-27 MED ORDER — FLEET ENEMA 7-19 GM/118ML RE ENEM: 1.0000 | ENEMA | RECTAL | Status: DC | PRN

## 2019-11-27 MED ORDER — OXYCODONE HCL 5 MG PO TABS
5.0000 mg | ORAL_TABLET | ORAL | Status: DC | PRN
  Administered 2019-11-28: 10 mg via ORAL
  Filled 2019-11-27: qty 2

## 2019-11-27 MED ORDER — FENTANYL CITRATE (PF) 100 MCG/2ML IJ SOLN
INTRAMUSCULAR | Status: DC | PRN
Start: 2019-11-27 — End: 2019-11-27
  Administered 2019-11-27: 100 ug via INTRAVENOUS
  Administered 2019-11-27: 200 ug via INTRAVENOUS
  Administered 2019-11-27: 50 ug via INTRAVENOUS

## 2019-11-27 MED ORDER — METOCLOPRAMIDE HCL 5 MG/ML IJ SOLN
INTRAMUSCULAR | Status: DC | PRN
  Administered 2019-11-27: 10 mg via INTRAVENOUS

## 2019-11-27 MED ORDER — SENNOSIDES-DOCUSATE SODIUM 8.6-50 MG PO TABS
2.0000 | ORAL_TABLET | ORAL | Status: DC
  Administered 2019-11-28 – 2019-11-29 (×3): 2 via ORAL
  Filled 2019-11-27 (×3): qty 2

## 2019-11-27 MED ORDER — SIMETHICONE 80 MG PO CHEW
80.0000 mg | CHEWABLE_TABLET | ORAL | Status: DC
  Administered 2019-11-28 – 2019-11-29 (×4): 80 mg via ORAL
  Filled 2019-11-27 (×3): qty 1

## 2019-11-27 MED ORDER — IBUPROFEN 800 MG PO TABS
800.0000 mg | ORAL_TABLET | Freq: Four times a day (QID) | ORAL | Status: DC
  Administered 2019-11-28 – 2019-11-30 (×7): 800 mg via ORAL
  Filled 2019-11-27 (×7): qty 1

## 2019-11-27 MED ORDER — SUCCINYLCHOLINE CHLORIDE 200 MG/10ML IV SOSY
PREFILLED_SYRINGE | INTRAVENOUS | Status: AC
  Filled 2019-11-27: qty 10

## 2019-11-27 MED ORDER — FENTANYL CITRATE (PF) 100 MCG/2ML IJ SOLN: 25.0000 ug | INTRAMUSCULAR | Status: DC | PRN

## 2019-11-27 MED ORDER — LACTATED RINGERS IV SOLN: INTRAVENOUS | Status: DC | PRN

## 2019-11-27 MED ORDER — SIMETHICONE 80 MG PO CHEW: 80.0000 mg | CHEWABLE_TABLET | ORAL | Status: DC | PRN

## 2019-11-27 MED ORDER — OXYTOCIN-SODIUM CHLORIDE 30-0.9 UT/500ML-% IV SOLN
INTRAVENOUS | Status: DC | PRN
  Administered 2019-11-27: 30 [IU] via INTRAVENOUS

## 2019-11-27 MED ORDER — METOCLOPRAMIDE HCL 5 MG/ML IJ SOLN
INTRAMUSCULAR | Status: AC
  Filled 2019-11-27: qty 2

## 2019-11-27 MED ORDER — SUCCINYLCHOLINE CHLORIDE 200 MG/10ML IV SOSY
PREFILLED_SYRINGE | INTRAVENOUS | Status: DC | PRN
  Administered 2019-11-27: 200 mg via INTRAVENOUS

## 2019-11-27 MED ORDER — SOD CITRATE-CITRIC ACID 500-334 MG/5ML PO SOLN: 30.0000 mL | Freq: Once | ORAL | Status: DC

## 2019-11-27 SURGICAL SUPPLY — 42 items
APL SKNCLS STERI-STRIP NONHPOA (GAUZE/BANDAGES/DRESSINGS) ×1
BENZOIN TINCTURE PRP APPL 2/3 (GAUZE/BANDAGES/DRESSINGS) ×3 IMPLANT
CHLORAPREP W/TINT 26ML (MISCELLANEOUS) ×3 IMPLANT
CLAMP CORD UMBIL (MISCELLANEOUS) IMPLANT
CLOSURE WOUND 1/2 X4 (GAUZE/BANDAGES/DRESSINGS) ×1
CLOTH BEACON ORANGE TIMEOUT ST (SAFETY) ×3 IMPLANT
DRAPE C SECTION CLR SCREEN (DRAPES) IMPLANT
DRSG OPSITE POSTOP 4X10 (GAUZE/BANDAGES/DRESSINGS) ×3 IMPLANT
ELECT REM PT RETURN 9FT ADLT (ELECTROSURGICAL) ×3
ELECTRODE REM PT RTRN 9FT ADLT (ELECTROSURGICAL) ×1 IMPLANT
EXTRACTOR VACUUM M CUP 4 TUBE (SUCTIONS) IMPLANT
EXTRACTOR VACUUM M CUP 4' TUBE (SUCTIONS)
GLOVE BIO SURGEON STRL SZ7.5 (GLOVE) ×3 IMPLANT
GLOVE BIOGEL PI IND STRL 7.0 (GLOVE) ×1 IMPLANT
GLOVE BIOGEL PI INDICATOR 7.0 (GLOVE) ×2
GOWN STRL REUS W/TWL 2XL LVL3 (GOWN DISPOSABLE) ×3 IMPLANT
GOWN STRL REUS W/TWL LRG LVL3 (GOWN DISPOSABLE) ×6 IMPLANT
KIT ABG SYR 3ML LUER SLIP (SYRINGE) IMPLANT
NDL HYPO 25X5/8 SAFETYGLIDE (NEEDLE) IMPLANT
NEEDLE HYPO 22GX1.5 SAFETY (NEEDLE) ×3 IMPLANT
NEEDLE HYPO 25X5/8 SAFETYGLIDE (NEEDLE) IMPLANT
NS IRRIG 1000ML POUR BTL (IV SOLUTION) ×3 IMPLANT
PACK C SECTION WH (CUSTOM PROCEDURE TRAY) ×3 IMPLANT
PAD OB MATERNITY 4.3X12.25 (PERSONAL CARE ITEMS) ×3 IMPLANT
PENCIL SMOKE EVAC W/HOLSTER (ELECTROSURGICAL) ×3 IMPLANT
RTRCTR C-SECT PINK 25CM LRG (MISCELLANEOUS) ×3 IMPLANT
SPONGE LAP 18X18 RF (DISPOSABLE) ×4 IMPLANT
STRIP CLOSURE SKIN 1/2X4 (GAUZE/BANDAGES/DRESSINGS) ×2 IMPLANT
SUT CHROMIC 1 CTX 36 (SUTURE) ×8 IMPLANT
SUT VIC AB 1 CT1 36 (SUTURE) ×6 IMPLANT
SUT VIC AB 2-0 CT1 (SUTURE) ×3 IMPLANT
SUT VIC AB 2-0 CT1 27 (SUTURE) ×6
SUT VIC AB 2-0 CT1 TAPERPNT 27 (SUTURE) ×1 IMPLANT
SUT VIC AB 3-0 CT1 27 (SUTURE) ×6
SUT VIC AB 3-0 CT1 TAPERPNT 27 (SUTURE) ×2 IMPLANT
SUT VIC AB 3-0 SH 27 (SUTURE)
SUT VIC AB 3-0 SH 27X BRD (SUTURE) IMPLANT
SUT VIC AB 4-0 KS 27 (SUTURE) ×3 IMPLANT
SYR BULB IRRIGATION 50ML (SYRINGE) IMPLANT
TOWEL OR 17X24 6PK STRL BLUE (TOWEL DISPOSABLE) ×3 IMPLANT
TRAY FOLEY W/BAG SLVR 14FR LF (SET/KITS/TRAYS/PACK) ×3 IMPLANT
WATER STERILE IRR 1000ML POUR (IV SOLUTION) ×3 IMPLANT

## 2019-11-27 NOTE — Transfer of Care (Signed)
Immediate Anesthesia Transfer of Care Note  Patient: Teresa Sanchez  Procedure(s) Performed: CESAREAN SECTION (N/A )  Patient Location: PACU  Anesthesia Type:General  Level of Consciousness: awake, alert  and oriented  Airway & Oxygen Therapy: Patient Spontanous Breathing and Patient connected to face mask oxygen  Post-op Assessment: Report given to RN, Post -op Vital signs reviewed and stable and Patient moving all extremities X 4  Post vital signs: Reviewed and stable  Last Vitals:  Vitals Value Taken Time  BP 135/94 11/27/19 1707  Temp 36.8 C 11/27/19 1707  Pulse 93 11/27/19 1713  Resp 12 11/27/19 1713  SpO2 100 % 11/27/19 1713  Vitals shown include unvalidated device data.  Last Pain:  Vitals:   11/27/19 1707  TempSrc: Oral  PainSc: 5          Complications: No complications documented.

## 2019-11-27 NOTE — Brief Op Note (Cosign Needed)
Teresa Sanchez   11/27/2019  PREOPERATIVE DIAGNOSES: Intrauterine pregnancy at [redacted]w[redacted]d weeks gestation; non-reassuring fetal status history of prior Cesarean Section   POSTOPERATIVE DIAGNOSES: The same  PROCEDURE: Repeat Low Transverse Cesarean Section  SURGEON:  Dr. Mariel Aloe  ASSISTANT:  Dr. Casper Harrison   ANESTHESIA: General  INDICATIONS: Teresa Sanchez is a 39 y.o. G3P1011 at [redacted]w[redacted]d here for cesarean section secondary to the indications listed under preoperative diagnoses; please see preoperative note for further details.  The risks of surgery were discussed with the patient including but were not limited to: bleeding which may require transfusion or reoperation; infection which may require antibiotics; injury to bowel, bladder, ureters or other surrounding organs; injury to the fetus; need for additional procedures including hysterectomy in the event of a life-threatening hemorrhage; formation of adhesions; placental abnormalities wth subsequent pregnancies; incisional problems; thromboembolic phenomenon and other postoperative/anesthesia complications.  The patient concurred with the proposed plan, giving informed written consent for the procedure.    FINDINGS:  Viable female infant in cephalic presentation.  Apgars 7 and 9.  Thick meconium amniotic fluid.  Intact placenta, three vessel cord.  Normal uterus, fallopian tubes and ovaries bilaterally.  INTRAVENOUS FLUIDS: 2250 ml   ESTIMATED BLOOD LOSS: 853 ml URINE OUTPUT:  100 ml SPECIMENS: Placenta sent to pathology COMPLICATIONS: None immediate  PROCEDURE IN DETAIL:  The patient preoperatively received intravenous antibiotics and had sequential compression devices applied to her lower extremities.  She was then taken to the operating room where she was placed under general anesthesia due to inability to obtain spinal anesthesia. She was then placed in a dorsal supine position with a leftward tilt, and prepped and draped in a  sterile manner.  A foley catheter was placed into her bladder and attached to constant gravity.  After an adequate timeout was performed, a Pfannenstiel skin incision was made with scalpel two fingerbreaths above the pubic symphysis on her preexisting scar and carried through to the underlying layer of fascia. The fascia was incised in the midline, and this incision was extended bilaterally using the Mayo scissors.  Kocher clamps x 2 were applied to the superior aspect of the fascial incision and the underlying rectus muscles were dissected off bluntly and sharply.  A similar process was carried out on the inferior aspect of the fascial incision. The rectus muscles were separated in the midline and the peritoneum was entered bluntly. Attention was turned to the lower uterine segment where a low transverse hysterotomy was made with a scalpel and extended bilaterally bluntly.  The infant was successfully delivered, the cord was clamped and cut after one minute, and the infant was handed over to the awaiting neonatology team. The Alexis self-retaining retractor was introduced into the abdominal cavity.  Uterine massage was then administered, and the placenta delivered intact with a three-vessel cord. The uterus was then cleared of clots and debris using manual curettage.  The uterine incision was closed with 1-0 chromic in a running locked fashion, and an imbricating layer was also placed with 1-0 chromic.  Figure-of-eight 0 Vicryl serosal stitches were placed to help with hemostasis.  The pelvis was cleared of all clot and debris with irrigation and suction. Hemostasis was confirmed on all surfaces.  The retractor was removed.  The peritoneum was closed with a 2-0 Vicryl running stitch.. The fascia was then closed using 0 Vicryl in a running fashion.  The subcutaneous layer was irrigated, reapproximated with 2-0 plain gut interrupted stitches, and the skin was closed  with a 4-0 Vicryl on a Keith stitch. The patient  tolerated the procedure well. Sponge, instrument and needle counts were correct x 3.  She was taken to the recovery room in stable condition.   Casper Harrison, MD [facility] Family Medicine Fellow, [facility] for [facility], [facility]

## 2019-11-27 NOTE — Anesthesia Postprocedure Evaluation (Signed)
Anesthesia Post Note  Patient: Teresa Sanchez  Procedure(s) Performed: CESAREAN SECTION (N/A )     Patient location during evaluation: Women's Unit Anesthesia Type: General Level of consciousness: sedated Pain management: pain level controlled Vital Signs Assessment: post-procedure vital signs reviewed and stable Respiratory status: spontaneous breathing and respiratory function stable Cardiovascular status: stable Postop Assessment: no apparent nausea or vomiting Anesthetic complications: no   No complications documented.  Last Vitals:  Vitals:   11/27/19 1730 11/27/19 1745  BP: 140/89 118/84  Pulse: 98 91  Resp: 16 10  Temp:    SpO2: 100% 98%    Last Pain:  Vitals:   11/27/19 1745  TempSrc:   PainSc: 4    Pain Goal:                   Mellody Dance

## 2019-11-27 NOTE — Anesthesia Procedure Notes (Signed)
Procedure Name: Intubation Date/Time: 11/27/2019 3:47 PM Performed by: Lenox Ahr, CRNA Pre-anesthesia Checklist: Patient identified, Patient being monitored, Emergency Drugs available and Suction available Patient Re-evaluated:Patient Re-evaluated prior to induction Oxygen Delivery Method: Circle System Utilized and Circle system utilized Preoxygenation: Pre-oxygenation with 100% oxygen Induction Type: IV induction, Rapid sequence and Cricoid Pressure applied Laryngoscope Size: 3 and Glidescope Grade View: Grade I Tube type: Oral Tube size: 7.0 mm Number of attempts: 1 Airway Equipment and Method: stylet and Rigid stylet Placement Confirmation: ETT inserted through vocal cords under direct vision,  positive ETCO2 and breath sounds checked- equal and bilateral Secured at: 22 cm Tube secured with: Tape Dental Injury: Teeth and Oropharynx as per pre-operative assessment

## 2019-11-27 NOTE — H&P (Addendum)
OBSTETRIC ADMISSION HISTORY AND PHYSICAL  Teresa Sanchez is a 39 y.o. female G55P1011 with IUP at 61w5dby 10 wk [place] for decreased fetal movement and ctx. She reports No LOF, no VB, no blurry vision, headaches or peripheral edema, and RUQ pain.  She plans on breast feeding. She is undecided on contraception. She desires TOLAC but does not want Pitocin.   [facility] [place]--->  Estimated Date of Delivery: 11/22/19  Sono:  _0 , CWD, normal anatomy, cephalic presentation,  [id number] 86% EFW  Prenatal History/Complications: GBS positive TOLAC Hx of C-section for failure to progress Nicotine use Obesity - on bASA Hx of LEEP procedure AMA Possible PPROM at 17 weeks  Past Medical History: Past Medical History:  Diagnosis Date  . Medical history non-contributory   . Preterm premature rupture of membranes (PPROM) with unknown onset of labor 10/03/2019   Diagnosed at 17.6 weeks.  Followed by MFM.  .Marland KitchenVaginal Pap smear, abnormal     Past Surgical History: Past Surgical History:  Procedure Laterality Date  . CESAREAN SECTION    . LEEP  2018    Obstetrical History: OB History    Gravida  3   Para  1   Term  1   Preterm      AB  1   Living  1     SAB      TAB  1   Ectopic      Multiple      Live Births  1           Social History: Social History   Socioeconomic History  . Marital status: Married    Spouse name: Not on file  . Number of children: Not on file  . Years of education: Not on file  . Highest education level: Not on file  Occupational History  . Not on file  Tobacco Use  . Smoking status: Former Smoker    Packs/day: 0.25    Types: Cigarettes    Quit date: 08/23/2019    Years since quitting: 0.2  . Smokeless tobacco: Never Used  . Tobacco comment: 2 cigs/day  Vaping Use  . Vaping Use: Never used  Substance and Sexual Activity  . Alcohol use: Not Currently    Comment: occasionally    . Drug use: No  . Sexual activity: Yes    Birth control/protection: None  Other Topics Concern  . Not on file  Social History Narrative  . Not on file   Social Determinants of Health   Financial Resource Strain:   . Difficulty of Paying Living Expenses: Not on file  Food Insecurity:   . Worried About RCharity fundraiserin the Last Year: Not on file  . Ran Out of Food in the Last Year: Not on file  Transportation Needs:   . Lack of Transportation (Medical): Not on file  . Lack of Transportation (Non-Medical): Not on file  Physical Activity:   . Days of Exercise per Week: Not on file  . Minutes of Exercise per Session: Not on file  Stress:   . Feeling of Stress : Not on file  Social Connections:   . Frequency of Communication with Friends and Family: Not on file  . Frequency of Social Gatherings with Friends and Family: Not on file  . Attends Religious Services: Not on file  . Active Member of Clubs or Organizations: Not on file  .  Attends Archivist Meetings: Not on file  . Marital Status: Not on file    Family History: Family History  Problem Relation Age of Onset  . Stroke Mother   . Diabetes Mother   . COPD Mother   . Hypertension Father   . Prostate cancer Father   . Stroke Maternal Grandmother   . Diabetes Maternal Grandfather     Allergies: No Known Allergies  Medications Prior to Admission  Medication Sig Dispense Refill Last Dose  . Ascorbic Acid (VITAMIN C PO) Take by mouth.   11/26/2019 at Unknown time  . aspirin EC 81 MG tablet Take 1 tablet (81 mg total) by mouth daily. Take after 12 weeks for prevention of preeclampsia later in pregnancy 300 tablet 2 11/26/2019 at Unknown time  . Blood Pressure Monitoring (BLOOD PRESSURE KIT) DEVI 1 Device by Does not apply route as needed. 1 each 0 Past Week at Unknown time  . ferrous sulfate 325 (65 FE) MG tablet Take 325 mg by mouth daily with breakfast.   11/26/2019 at Unknown time  . MAGNESIUM PO Take by  mouth.   11/26/2019 at Unknown time  . Prenatal Vit w/Fe-Methylfol-FA (PNV PO) Take by mouth.   11/26/2019 at Unknown time  . Probiotic Product (PROBIOTIC PO) Take by mouth.   11/26/2019 at Unknown time  . VITAMIN D PO Take by mouth.   11/26/2019 at Unknown time  . VITAMIN E PO Take by mouth.   11/26/2019 at Unknown time     Review of Systems   All systems reviewed and negative except as stated in HPI  Blood pressure 125/86, pulse 92, temperature 98.7 F (37.1 C), resp. rate 16, weight (!) 144.7 kg, last menstrual period 02/09/2019, SpO2 98 %. General appearance: alert, cooperative and no distress Lungs: clear to auscultation bilaterally Heart: regular rate and rhythm Abdomen: soft, non-tender; bowel sounds normal Extremities: Homans sign is negative, no sign of DVT Presentation: cephalic Fetal monitoringBaseline: 170 bpm, Variability: Good {> 6 bpm), Accelerations: none and Decelerations: Variable: severe Uterine activity q3 Dilation: 1 Effacement (%): 50 Station: -3 Exam by:: Elray Mcgregor, RN   Prenatal labs: ABO, Rh: A/Positive/-- (01/25 1200) Antibody: Negative (01/25 1200) Rubella: 4.90 (01/25 1200) RPR: Non Reactive (06/08 0934)  HBsAg: Negative (01/25 1200)  HIV: Non Reactive (06/08 0934)  GBS: Positive/-- (08/10 1114)  1 hr Glucola WNL Genetic screening  Low risk Anatomy US incomplete initial [place],   Prenatal Transfer Tool  Maternal Diabetes: No Genetic Screening: Normal Maternal Ultrasounds/Referrals: Normal Fetal Ultrasounds or other Referrals:  None Maternal Substance Abuse:  No Significant Maternal Medications:  None Significant Maternal Lab Results: Group B Strep positive  No results found for this or any previous visit (from the past 24 hour(s)).  Patient Active Problem List   Diagnosis Date Noted  . Post-dates pregnancy 11/27/2019  . GBS (group B Streptococcus carrier), +RV culture, currently pregnant 11/09/2019  . Preterm premature rupture of membranes  (PPROM) with unknown onset of labor 10/03/2019  . AMA (advanced maternal age) multigravida 35+ 05/06/2019  . History of LEEP (loop electrosurgical excision procedure) of cervix complicating pregnancy 65/79/0383  . Maternal obesity affecting pregnancy, antepartum 04/24/2019  . Previous cesarean delivery affecting pregnancy, antepartum 04/24/2019  . Encounter for supervision of high risk multigravida of advanced maternal age, antepartum 04/06/2019  . Smoking (tobacco) complicating pregnancy, first trimester 04/06/2019  . CIN II (cervical intraepithelial neoplasia II) 01/15/2017  . ASCUS with positive high risk HPV cervical 12/03/2016  Assessment/Plan:  Teresa Sanchez is a 39 y.o. G3P1011 at 63w5dhere for decreased fetal movement.  #Labor: latent #Pain: Per request #FWB: Cat II #ID:  GBS pos, PCN #MOF: Breast #MOC: prefer LARC instead #Circ:  planning  PMatilde Haymaker MD  11/27/2019, 1:48 PM  Midwife attestation: I have seen and examined this patient; I agree with above documentation in the resident's note.   PE: Gen: calm comfortable, NAD Resp: normal effort and rate Abd: gravid  ROS, labs, PMH reviewed  Assessment/Plan: 40.[redacted] weeks gestation Labor: latent FWB: Cat II GBS: pos Admit to LD Recurrent variable decels and fetal tachycardia. Terbutaline and IVF bolus started. Dr. BElgie Congoconsulted for CS.  MJulianne Handler CNM  11/27/2019, 2:55 PM

## 2019-11-27 NOTE — MAU Note (Addendum)
Pt reports ctx's that started at 0230. Pt reports regular ctx's 3-5 minutes since 1200.   Reports bloody show, Denies LOF.   Reports decreased fetal movement since 1100.

## 2019-11-27 NOTE — Progress Notes (Signed)
MD CTSP regarding repetitive variable decelerations down to the 90s.  Per midwife, pt stated she could hear the heartbeat drop at home as well.  Pt initially wanted a TOLAC.  Fetal tachycardia also noted to 170s.  The midwife checked the patient and she was 1/50/-3.  The midwife had given a dose of subcutaneous terbutaline. Ctx are q 3 minutes and variable decels continue.  Discussed with patient findings and decision for repeat cesarean section.  Pt agrees and declines BTL at this time.  Risks and benefits of the procedure were given including bleeding, infection, involvement of other organs including bladder and bowel.  O: FHT: 170s-180s with recurrent variable decels.  Moderate variability      Toco: q 3 minutes     Category 2 strip  A/P: nonreassuring fetal tracing with fetal tachycardia remote from delivery Move to repeat LTCS now. Anesthesia and OR team alerted

## 2019-11-27 NOTE — Discharge Summary (Signed)
Postpartum Discharge Summary  Date of Service updated9/1/21     Patient Name: Teresa Sanchez DOB: 06/13/1980 MRN: [id number]  Date of admission: 11/27/2019 Delivery date:11/27/2019  Delivering provider: Janet Berlin  Date of discharge: 11/29/2019  Admitting diagnosis: Post-dates pregnancy [O48.0] Intrauterine pregnancy: [redacted]w[redacted]d    Secondary diagnosis:  Active Problems:   Encounter for supervision of high risk multigravida of advanced maternal age, antepartum   Maternal obesity affecting pregnancy, antepartum   Previous cesarean delivery affecting pregnancy, antepartum   AMA (advanced maternal age) multigravida 35+   Preterm premature rupture of membranes (PPROM) with unknown onset of labor   GBS (group B Streptococcus carrier), +RV culture, currently pregnant   Post-dates pregnancy   Cesarean delivery delivered  Additional problems: none    Discharge diagnosis: Term Pregnancy Delivered                                              Post partum procedures:none Augmentation: N/A Complications: None  Hospital course: Onset of Labor With Unplanned C/S   39y.o. yo G3P1011 at 440w5das admitted in Latent Labor on 11/27/2019. Patient had a labor course significant for presenting to MAU in latent labor with initial SVE 1/50/-3. Her FHT demonstrated a baseline of 180 with recurrent deep variables. Due to NRBaxterdecision was made to proceed with rLTCS. The patient went for cesarean section due to Non-Reassuring FHR. Delivery details as follows: Membrane Rupture Time/Date: 2:43 PM ,11/27/2019   Delivery Method:C-Section, Low Transverse  Details of operation can be found in separate operative note. Patient had an uncomplicated postpartum course.  She is ambulating,tolerating a regular diet, passing flatus, and urinating well.  Patient is discharged home in stable condition 11/29/19.  Newborn Data: Birth date:11/27/2019  Birth time:3:49 PM  Gender:Female  Living status:Living  Apgars:7  ,8  Weight:3180 g   Magnesium Sulfate received: No BMZ received: No Rhophylac:N/A MMR:No T-DaP:Given prenatally Flu: No Transfusion:No  Physical exam  Vitals:   11/28/19 0826 11/28/19 1617 11/28/19 2213 11/29/19 0516  BP: 104/70 117/72 107/71 114/79  Pulse:   86 75  Resp: _0 Temp: 98.8 F (37.1 C) 98.1 F (36.7 C) 98 F (36.7 C) 97.9 F (36.6 C)  TempSrc:   Oral Oral  SpO2: 99% 99% 100% 100%  Weight:       General: alert, cooperative and no distress Lochia: appropriate Uterine Fundus: firm Incision: Healing well with no significant drainage, Dressing is clean, dry, and intact DVT Evaluation: No evidence of DVT seen on physical exam. Labs: Lab Results  Component Value Date   WBC 19.0 (H) 11/28/2019   HGB 8.4 (L) 11/28/2019   HCT 25.7 (L) 11/28/2019   MCV 97.3 11/28/2019   PLT 238 11/28/2019   CMP Latest Ref Rng & Units 11/28/2019  Creatinine 0.44 - 1.00 mg/dL 0.85   Edinburgh Score: Edinburgh Postnatal Depression Scale Screening Tool 11/28/2019  I have been able to laugh and see the funny side of things. 0  I have looked forward with enjoyment to things. 0  I have blamed myself unnecessarily when things went wrong. 1  I have been anxious or worried for no good reason. 0  I have felt scared or panicky for no good reason. 0  Things have been getting on top of me. 0  I have been so unhappy  that I have had difficulty sleeping. 0  I have felt sad or miserable. 0  I have been so unhappy that I have been crying. 0  The thought of harming myself has occurred to me. 0  Edinburgh Postnatal Depression Scale Total 1     After visit meds:  Allergies as of 11/29/2019   No Known Allergies     Medication List    STOP taking these medications   aspirin EC 81 MG tablet     TAKE these medications   Blood Pressure Kit Devi 1 Device by Does not apply route as needed.   ferrous sulfate 325 (65 FE) MG tablet Take 325 mg by mouth daily with breakfast.    guaiFENesin 600 MG 12 hr tablet Commonly known as: MUCINEX Take 600 mg by mouth 2 (two) times daily as needed for cough.   ibuprofen 800 MG tablet Commonly known as: ADVIL Take 1 tablet (800 mg total) by mouth every 6 (six) hours.   MAGNESIUM PO Take 1 tablet by mouth daily.   oxyCODONE 5 MG immediate release tablet Commonly known as: Oxy IR/ROXICODONE Take 1 tablet (5 mg total) by mouth every 4 (four) hours as needed for moderate pain.   phenylephrine 10 MG Tabs tablet Commonly known as: SUDAFED PE Take 10 mg by mouth every 4 (four) hours as needed (congestion).   PNV PO Take 1 tablet by mouth daily.   PROBIOTIC PO Take 1 capsule by mouth daily.   VITAMIN C PO Take 1 tablet by mouth daily.   VITAMIN D PO Take 1 capsule by mouth daily.   VITAMIN E PO Take 1 tablet by mouth daily.        Discharge home in stable condition Infant Feeding: Breast Infant Disposition:home with mother Discharge instruction: per After Visit Summary and Postpartum booklet. Activity: Advance as tolerated. Pelvic rest for 6 weeks.  Diet: routine diet Future Appointments: Future Appointments  Date Time Provider [place]  12/06/2019  1:20 PM [place] None  12/25/2019 10:30 AM Constant, Peggy, MD [place] None   Follow up Visit:  [place]. Schedule an appointment as soon as possible for a visit in 1 week(s).   Specialty: Obstetrics and Gynecology Contact information: [address], [place] [phone]               Please schedule this patient for a In person postpartum visit in 6 weeks with the following provider: Any provider. Additional Postpartum F/U:Incision check 1 week  High risk pregnancy complicated by: repeat LTCS, hx of smoking, hx of LEEP  Delivery mode:  C-Section, Low Transverse  Anticipated Birth Control:  Unsure   11/29/2019 Hansel Feinstein,  CNM

## 2019-11-27 NOTE — Anesthesia Preprocedure Evaluation (Signed)
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    Pulmonary Patient abstained from smoking., former smoker,    Pulmonary exam normal breath sounds clear to auscultation       Cardiovascular Exercise Tolerance: Good negative cardio ROS Normal cardiovascular exam Rhythm:Regular Rate:Normal     Neuro/Psych negative neurological ROS  negative psych ROS   GI/Hepatic negative GI ROS, Neg liver ROS,   Endo/Other  Morbid obesity  Renal/GU negative Renal ROS     Musculoskeletal negative musculoskeletal ROS (+)   Abdominal (+) + obese,   Peds negative pediatric ROS (+)  Hematology  (+) anemia ,   Anesthesia Other Findings   Reproductive/Obstetrics (+) Pregnancy                             Anesthesia Physical Anesthesia Plan  ASA: III and emergent  Anesthesia Plan: Spinal   Post-op Pain Management:    Induction:   PONV Risk Score and Plan: 2 and Scopolamine patch - Pre-op and Ondansetron  Airway Management Planned: Nasal Cannula  Additional Equipment:   Intra-op Plan:   Post-operative Plan:   Informed Consent: I have reviewed the patients History and Physical, chart, labs and discussed the procedure including the risks, benefits and alternatives for the proposed anesthesia with the patient or authorized representative who has indicated his/her understanding and acceptance.       Plan Discussed with: CRNA and Anesthesiologist  Anesthesia Plan Comments: (Spinal anesthesia, GETA as backup discussed with patient. She expressed understanding and wishes to proceed. )        Anesthesia Quick Evaluation

## 2019-11-28 ENCOUNTER — Encounter (HOSPITAL_COMMUNITY): Payer: Self-pay | Admitting: Anesthesiology

## 2019-11-28 LAB — CBC
HCT: 25.7 % — ABNORMAL LOW (ref 36.0–46.0)
Hemoglobin: 8.4 g/dL — ABNORMAL LOW (ref 12.0–15.0)
MCH: 31.8 pg (ref 26.0–34.0)
MCHC: 32.7 g/dL (ref 30.0–36.0)
MCV: 97.3 fL (ref 80.0–100.0)
Platelets: 238 10*3/uL (ref 150–400)
RBC: 2.64 MIL/uL — ABNORMAL LOW (ref 3.87–5.11)
RDW: 12.7 % (ref 11.5–15.5)
WBC: 19 10*3/uL — ABNORMAL HIGH (ref 4.0–10.5)
nRBC: 0 % (ref 0.0–0.2)

## 2019-11-28 LAB — CREATININE, SERUM
Creatinine, Ser: 0.85 mg/dL (ref 0.44–1.00)
GFR calc Af Amer: >60 mL/min (ref >60)
GFR calc non Af Amer: >60 mL/min (ref >60)

## 2019-11-28 LAB — RPR: RPR Ser Ql: NONREACTIVE

## 2019-11-28 MED ORDER — VITAMIN C 250 MG PO TABS
250.0000 mg | ORAL_TABLET | ORAL | Status: DC
  Administered 2019-11-28 – 2019-11-30 (×2): 250 mg via ORAL
  Filled 2019-11-28 (×2): qty 1

## 2019-11-28 MED ORDER — FERROUS GLUCONATE 324 (38 FE) MG PO TABS
324.0000 mg | ORAL_TABLET | ORAL | Status: DC
  Administered 2019-11-28 – 2019-11-30 (×2): 324 mg via ORAL
  Filled 2019-11-28 (×2): qty 1

## 2019-11-28 NOTE — Progress Notes (Signed)
Subjective: POD#1 Cesarean Delivery Patient is doing well without complaints. Ambulating without difficulty. Foley catheter still in place. Passing flatus. Tolerating PO. Abdominal pain improved. Vaginal bleeding decreased.   Objective: Vital signs in last 24 hours: Temp:  [98.2 F (36.8 C)-99 F (37.2 C)] 98.9 F (37.2 C) (08/31 0023) Pulse Rate:  [83-98] 85 (08/30 1933) Resp:  [10-23] 18 (08/31 0023) BP: (112-140)/(74-94) 112/74 (08/30 1933) SpO2:  [98 %-100 %] 100 % (08/31 0023) Weight:  [144.7 kg] 144.7 kg (08/30 1321)  Physical Exam:  General: alert, cooperative and no distress Lochia: appropriate Uterine Fundus: firm Incision: pressure dressing c/d/i DVT Evaluation: No evidence of DVT seen on physical exam.  Recent Labs    11/27/19 1351  HGB 11.0*  HCT 34.5*    Assessment/Plan: POD#1 rLTCS   -Doing well without complaints  -Meeting PP milestones  -Foley catheter in place  -hgb 11.0>>8.4, will initiate PO ferrous gluconate with vit C every other day  -breastfeeding  -still deciding on contraception, discussed options, leading towards nexplanon  -desires circ for baby, ordered/consented  Alric Seton 11/28/2019, 4:55 AM

## 2019-11-28 NOTE — Anesthesia Postprocedure Evaluation (Signed)
Anesthesia Post Note  Patient: Teresa Sanchez  Procedure(s) Performed: CESAREAN SECTION (N/A )     Patient location during evaluation: [facility] Anesthesia Type: General Level of consciousness: awake and alert Pain management: satisfactory to patient Vital Signs Assessment: post-procedure vital signs reviewed and stable Respiratory status: spontaneous breathing and respiratory function stable Cardiovascular status: stable Postop Assessment: adequate PO intake and able to ambulate Anesthetic complications: no   No complications documented.  Last Vitals:  Vitals:   11/28/19 0023 11/28/19 0510  BP:  110/73  Pulse:  79  Resp: 18 18  Temp: 37.2 C 37.3 C  SpO2: 100% 100%    Last Pain:  Vitals:   11/28/19 0510  TempSrc: Oral  PainSc: 0-No pain   Pain Goal:                   Luverna Degenhart

## 2019-11-28 NOTE — Lactation Note (Signed)
This note was copied from a baby's chart. Lactation Consultation Note  Patient Name: Teresa Sanchez OEVOJ'J Date: 11/28/2019 Reason for consult: Follow-up assessment  Follow-up visit with 25 hours infant, breastfeed exclusively with 0.47% weight lost. Mother is breastfeeding infant cradle hold, left breast upon arrival. Infant is comfortable, sucking rhythmically and he is able to maintain a deep latch. Noted audible swallowing. Mother explains infants has been at the breast for ~63minutes. Mother reports no nipple discomfort. Mother talked about lack of wet diapers at this point. Encouraged mother to continue offering the breast 8 - 12 times in 24 hours. Educated mom about supporting baby's neck to maintain a deep latch, massaging breast while breastfeeding and how to keep baby awake during session. Reviewed with mother normal newborn behavior during the first days of life. Mother verbalized understanding and all questions/concerned addressed at this time.  Mother is aware to call lactation as needed.     Feeding Feeding Type: Breast Fed  LATCH Score Latch: Grasps breast easily, tongue down, lips flanged, rhythmical sucking.  Audible Swallowing: Spontaneous and intermittent  Type of Nipple: Everted at rest and after stimulation (short shaft)  Comfort (Breast/Nipple): Soft / non-tender (large breasts)  Hold (Positioning): No assistance needed to correctly position infant at breast.  LATCH Score: 10  Interventions Interventions: Breast feeding basics reviewed;Breast compression;Adjust position;Support pillows  Lactation Tools Discussed/Used Tools: Pump Breast pump type: Double-Electric Breast Pump;Manual   Consult Status Consult Status: Follow-up Date: 11/29/19 Follow-up type: In-patient    Jayveion Stalling A Higuera Ancidey 11/28/2019, 5:38 PM

## 2019-11-28 NOTE — Addendum Note (Signed)
Addendum  created 11/28/19 0743 by Graciela Husbands, CRNA   Clinical Note Signed

## 2019-11-28 NOTE — Op Note (Signed)
Teresa Sanchez   11/27/2019  PREOPERATIVE DIAGNOSES: Intrauterine pregnancy at [redacted]w[redacted]d weeks gestation; non-reassuring fetal status history of prior Cesarean Section   POSTOPERATIVE DIAGNOSES: The same  PROCEDURE: Repeat Low Transverse Cesarean Section  SURGEON:  Dr. Lawrence Bass  ASSISTANT:  Dr. Jairon Ripberger   ANESTHESIA: General  INDICATIONS: Teresa Sanchez is a 39 y.o. G3P1011 at [redacted]w[redacted]d here for cesarean section secondary to the indications listed under preoperative diagnoses; please see preoperative note for further details.  The risks of surgery were discussed with the patient including but were not limited to: bleeding which may require transfusion or reoperation; infection which may require antibiotics; injury to bowel, bladder, ureters or other surrounding organs; injury to the fetus; need for additional procedures including hysterectomy in the event of a life-threatening hemorrhage; formation of adhesions; placental abnormalities wth subsequent pregnancies; incisional problems; thromboembolic phenomenon and other postoperative/anesthesia complications.  The patient concurred with the proposed plan, giving informed written consent for the procedure.    FINDINGS:  Viable female infant in cephalic presentation.  Apgars 7 and 9.  Thick meconium amniotic fluid.  Intact placenta, three vessel cord.  Normal uterus, fallopian tubes and ovaries bilaterally.  INTRAVENOUS FLUIDS: 2250 ml   ESTIMATED BLOOD LOSS: 853 ml URINE OUTPUT:  100 ml SPECIMENS: Placenta sent to pathology COMPLICATIONS: None immediate  PROCEDURE IN DETAIL:  The patient preoperatively received intravenous antibiotics and had sequential compression devices applied to her lower extremities.  She was then taken to the operating room where she was placed under general anesthesia due to inability to obtain spinal anesthesia. She was then placed in a dorsal supine position with a leftward tilt, and prepped and draped in a  sterile manner.  A foley catheter was placed into her bladder and attached to constant gravity.  After an adequate timeout was performed, a Pfannenstiel skin incision was made with scalpel two fingerbreaths above the pubic symphysis on her preexisting scar and carried through to the underlying layer of fascia. The fascia was incised in the midline, and this incision was extended bilaterally using the Mayo scissors.  Kocher clamps x 2 were applied to the superior aspect of the fascial incision and the underlying rectus muscles were dissected off bluntly and sharply.  A similar process was carried out on the inferior aspect of the fascial incision. The rectus muscles were separated in the midline and the peritoneum was entered bluntly. Attention was turned to the lower uterine segment where a low transverse hysterotomy was made with a scalpel and extended bilaterally bluntly.  The infant was successfully delivered, the cord was clamped and cut after one minute, and the infant was handed over to the awaiting neonatology team. The Alexis self-retaining retractor was introduced into the abdominal cavity.  Uterine massage was then administered, and the placenta delivered intact with a three-vessel cord. The uterus was then cleared of clots and debris using manual curettage.  The uterine incision was closed with 1-0 chromic in a running locked fashion, and an imbricating layer was also placed with 1-0 chromic.  Figure-of-eight 0 Vicryl serosal stitches were placed to help with hemostasis.  The pelvis was cleared of all clot and debris with irrigation and suction. Hemostasis was confirmed on all surfaces.  The retractor was removed.  The peritoneum was closed with a 2-0 Vicryl running stitch.. The fascia was then closed using 0 Vicryl in a running fashion.  The subcutaneous layer was irrigated, reapproximated with 2-0 plain gut interrupted stitches, and the skin was closed   with a 4-0 Vicryl on a Keith stitch. The patient  tolerated the procedure well. Sponge, instrument and needle counts were correct x 3.  She was taken to the recovery room in stable condition.   Casper Harrison, MD [facility] Family Medicine Fellow, [facility] for [facility], [facility]

## 2019-11-28 NOTE — Lactation Note (Signed)
This note was copied from a baby's chart. Lactation Consultation Note Baby 8 hrs old. Mom stated baby has latched to Lt. Breast. Mom has Large pendulous breast. Edema to lower aspect of breast. Reverse pressure to remove fluid some helpful. When areola compressed tissue thickens to orange peel effects and flattens. Nipple everts w/hand pump. Pre pumping suggested. Rt. Nipple is elongated. It appears to maybe have a large nipple then soft tissue thin line then a small nipple. It's hard to tell if 2 separate side by side nipples or one large nipple.  Baby's mouth is wide enough to latch nipple into mouth, but the thickness of the tissue causing impairment of deep latch and milk transfer.  Baby isn't able to hold nipple into mouth unless LC holding breast tissue in mouth.  Hand expression w/no colostrum noted at this time.  DEBP set up, shown mom how to set up pump. Will go back and work w/mom on pumping. Shells given for mom to wear in am w/bra.  RN needs to work w/mom so LC will return at later time for pumping and attempting to latch baby. Baby acts as if he needs to spit up but doesn't. Baby placed in bassinet.   Patient Name: Teresa Sanchez [id number] Date: 11/28/2019 Reason for consult: Initial assessment;Term   Maternal Data Has patient been taught Hand Expression?: Yes  Feeding    LATCH Score Latch: Too sleepy or reluctant, no latch achieved, no sucking elicited.  Audible Swallowing: None  Type of Nipple: Flat  Comfort (Breast/Nipple): Filling, red/small blisters or bruises, mild/mod discomfort (edema)  Hold (Positioning): Full assist, staff holds infant at breast  LATCH Score: 2  Interventions Interventions: Breast feeding basics reviewed;Breast compression;Assisted with latch;Adjust position;Hand pump;Skin to skin;Support pillows;DEBP;Breast massage;Position options;Hand express;Pre-pump if needed;Reverse pressure;Shells  Lactation Tools Discussed/Used      Consult Status Consult Status: Follow-up Date: 11/28/19 Follow-up type: In-patient    Teresa Sanchez 11/28/2019, 12:32 AM

## 2019-11-29 ENCOUNTER — Inpatient Hospital Stay (HOSPITAL_COMMUNITY): Payer: Medicaid Other

## 2019-11-29 ENCOUNTER — Inpatient Hospital Stay (HOSPITAL_COMMUNITY)
Admission: AD | Admit: 2019-11-29 | Payer: Medicaid Other | Source: Home / Self Care | Admitting: Obstetrics and Gynecology

## 2019-11-29 LAB — SURGICAL PATHOLOGY

## 2019-11-29 MED ORDER — ACETAMINOPHEN FOR CIRCUMCISION 160 MG/5 ML
40.0000 mg | Freq: Once | ORAL | Status: DC
  Filled 2019-11-29: qty 1.25

## 2019-11-29 MED ORDER — LIDOCAINE 1% INJECTION FOR CIRCUMCISION
0.8000 mL | INJECTION | Freq: Once | INTRAVENOUS | Status: DC
  Filled 2019-11-29: qty 1

## 2019-11-29 MED ORDER — ACETAMINOPHEN FOR CIRCUMCISION 160 MG/5 ML
40.0000 mg | ORAL | Status: DC | PRN
  Filled 2019-11-29: qty 1.25

## 2019-11-29 MED ORDER — SUCROSE 24% NICU/PEDS ORAL SOLUTION: 0.5000 mL | OROMUCOSAL | Status: DC | PRN

## 2019-11-29 MED ORDER — OXYCODONE HCL 5 MG PO TABS
5.0000 mg | ORAL_TABLET | ORAL | 0 refills | Status: DC | PRN
Start: 2019-11-29 — End: 2022-05-09

## 2019-11-29 MED ORDER — IBUPROFEN 800 MG PO TABS: 800.0000 mg | ORAL_TABLET | Freq: Four times a day (QID) | ORAL | 0 refills | Status: DC

## 2019-11-29 MED ORDER — EPINEPHRINE TOPICAL FOR CIRCUMCISION 0.1 MG/ML
1.0000 [drp] | TOPICAL | Status: DC | PRN
  Filled 2019-11-29: qty 1

## 2019-11-29 MED ORDER — WHITE PETROLATUM EX OINT: 1.0000 "application " | TOPICAL_OINTMENT | CUTANEOUS | Status: DC | PRN

## 2019-11-29 NOTE — Discharge Instructions (Signed)
Cesarean Delivery, Care After This sheet gives you information about how to care for yourself after your procedure. Your health care provider may also give you more specific instructions. If you have problems or questions, contact your health care provider. What can I expect after the procedure? After the procedure, it is common to have:  A small amount of blood or clear fluid coming from the incision.  Some redness, swelling, and pain in your incision area.  Some abdominal pain and soreness.  Vaginal bleeding (lochia). Even though you did not have a vaginal delivery, you will still have vaginal bleeding and discharge.  Pelvic cramps.  Fatigue. You may have pain, swelling, and discomfort in the tissue between your vagina and your anus (perineum) if:  Your C-section was unplanned, and you were allowed to labor and push.  An incision was made in the area (episiotomy) or the tissue tore during attempted vaginal delivery. Follow these instructions at home: Incision care   Follow instructions from your health care provider about how to take care of your incision. Make sure you: ? Wash your hands with soap and water before you change your bandage (dressing). If soap and water are not available, use hand sanitizer. ? If you have a dressing, change it or remove it as told by your health care provider. ? Leave stitches (sutures), skin staples, skin glue, or adhesive strips in place. These skin closures may need to stay in place for 2 weeks or longer. If adhesive strip edges start to loosen and curl up, you may trim the loose edges. Do not remove adhesive strips completely unless your health care provider tells you to do that.  Check your incision area every day for signs of infection. Check for: ? More redness, swelling, or pain. ? More fluid or blood. ? Warmth. ? Pus or a bad smell.  Do not take baths, swim, or use a hot tub until your health care provider says it's okay. Ask your health  care provider if you can take showers.  When you cough or sneeze, hug a pillow. This helps with pain and decreases the chance of your incision opening up (dehiscing). Do this until your incision heals. Medicines  Take over-the-counter and prescription medicines only as told by your health care provider.  If you were prescribed an antibiotic medicine, take it as told by your health care provider. Do not stop taking the antibiotic even if you start to feel better.  Do not drive or use heavy machinery while taking prescription pain medicine. Lifestyle  Do not drink alcohol. This is especially important if you are breastfeeding or taking pain medicine.  Do not use any products that contain nicotine or tobacco, such as cigarettes, e-cigarettes, and chewing tobacco. If you need help quitting, ask your health care provider. Eating and drinking  Drink at least 8 eight-ounce glasses of water every day unless told not to by your health care provider. If you breastfeed, you may need to drink even more water.  Eat high-fiber foods every day. These foods may help prevent or relieve constipation. High-fiber foods include: ? Whole grain cereals and breads. ? Brown rice. ? Beans. ? Fresh fruits and vegetables. Activity   If possible, have someone help you care for your baby and help with household activities for at least a few days after you leave the hospital.  Return to your normal activities as told by your health care provider. Ask your health care provider what activities are safe for   you.  Rest as much as possible. Try to rest or take a nap while your baby is sleeping.  Do not lift anything that is heavier than 10 lbs (4.5 kg), or the limit that you were told, until your health care provider says that it is safe.  Talk with your health care provider about when you can engage in sexual activity. This may depend on your: ? Risk of infection. ? How fast you heal. ? Comfort and desire to  engage in sexual activity. General instructions  Do not use tampons or douches until your health care provider approves.  Wear loose, comfortable clothing and a supportive and well-fitting bra.  Keep your perineum clean and dry. Wipe from front to back when you use the toilet.  If you pass a blood clot, save it and call your health care provider to discuss. Do not flush blood clots down the toilet before you get instructions from your health care provider.  Keep all follow-up visits for you and your baby as told by your health care provider. This is important. Contact a health care provider if:  You have: ? A fever. ? Bad-smelling vaginal discharge. ? Pus or a bad smell coming from your incision. ? Difficulty or pain when urinating. ? A sudden increase or decrease in the frequency of your bowel movements. ? More redness, swelling, or pain around your incision. ? More fluid or blood coming from your incision. ? A rash. ? Nausea. ? Little or no interest in activities you used to enjoy. ? Questions about caring for yourself or your baby.  Your incision feels warm to the touch.  Your breasts turn red or become painful or hard.  You feel unusually sad or worried.  You vomit.  You pass a blood clot from your vagina.  You urinate more than usual.  You are dizzy or light-headed. Get help right away if:  You have: ? Pain that does not go away or get better with medicine. ? Chest pain. ? Difficulty breathing. ? Blurred vision or spots in your vision. ? Thoughts about hurting yourself or your baby. ? New pain in your abdomen or in one of your legs. ? A severe headache.  You faint.  You bleed from your vagina so much that you fill more than one sanitary pad in one hour. Bleeding should not be heavier than your heaviest period. Summary  After the procedure, it is common to have pain at your incision site, abdominal cramping, and slight bleeding from your vagina.  Check  your incision area every day for signs of infection.  Tell your health care provider about any unusual symptoms.  Keep all follow-up visits for you and your baby as told by your health care provider. This information is not intended to replace advice given to you by your health care provider. Make sure you discuss any questions you have with your health care provider. Document Revised: 09/22/2017 Document Reviewed: 09/22/2017 Elsevier Patient Education  2020 [employer]  

## 2019-11-29 NOTE — Lactation Note (Addendum)
This note was copied from a baby's chart. Lactation Consultation Note  Patient Name: Teresa Sanchez [id number] Date: 11/29/2019 Reason for consult: Follow-up assessment Mom reports she feels things are getting better.  Mom reports he had a really big void and poop finally.  Mom reports she is breastfeeding and also pumping and feeding donor milk.  Praised mom for her efforts.  Encouraged her to continue to do what she was doing that she was doing everything right.   Urged her to call lactation as needed.  Maternal Data    Feeding Feeding Type: Donor Breast Milk Nipple Type: Dr. Levert Feinstein Preemie  LATCH Score                   Interventions Interventions: Breast feeding basics reviewed  Lactation Tools Discussed/Used     Consult Status Consult Status: Follow-up Date: 11/30/19 Follow-up type: In-patient    Lacole Komorowski Michaelle Copas 11/29/2019, 8:15 PM

## 2019-11-29 NOTE — Lactation Note (Signed)
This note was copied from a baby's chart. Lactation Consultation Note Baby 35 hrs old. Still no void. Encouraged mom to post pump Q3 hrs. Coconut oil given applied. Mom pumped no colostrum noted. Can hand express drops colostrum. Baby latched well to Rt. Breast. Mom had baby tucked under breast. Laid baby on his side beside breast. Baby BF well for 25 minutes. Breast has softened and edema less than previous day. Shells encouraged to wear today.  No swallows heard during feeding. Gave mom supplemental sheet for BF. Encouraged mom to supplement after BF q3 hrs. W/donor milk or EBM.  Mom gave baby 10 ml DBM after 25 min. BF. Mom stated she BF her 44 yr old for 10 months but had to give formula also. Encouraged good I&O and STS.  Encouraged mom to call for assistance today for BF.  Patient Name: Teresa Sanchez [id number] Date: 11/29/2019 Reason for consult: Follow-up assessment;Term   Maternal Data    Feeding Feeding Type: Donor Breast Milk Nipple Type: Slow - flow  LATCH Score Latch: Grasps breast easily, tongue down, lips flanged, rhythmical sucking.  Audible Swallowing: None  Type of Nipple: Everted at rest and after stimulation  Comfort (Breast/Nipple): Filling, red/small blisters or bruises, mild/mod discomfort (edema)  Hold (Positioning): Assistance needed to correctly position infant at breast and maintain latch.  LATCH Score: 6  Interventions Interventions: Breast feeding basics reviewed;Breast compression;Assisted with latch;Adjust position;Skin to skin;Support pillows;DEBP;Breast massage;Position options;Hand express;Coconut oil;Reverse pressure;Shells  Lactation Tools Discussed/Used Pump Review: Setup, frequency, and cleaning;Milk Storage Initiated by:: Peri Jefferson RN IBCLC Date initiated:: 11/28/19   Consult Status Consult Status: Follow-up Date: 11/29/19 Follow-up type: In-patient    [name], Diamond Nickel 11/29/2019, 6:30 AM

## 2019-11-30 ENCOUNTER — Encounter (HOSPITAL_COMMUNITY): Payer: Self-pay | Admitting: *Deleted

## 2019-11-30 NOTE — Lactation Note (Signed)
This note was copied from a baby's chart. Lactation Consultation Note F/u w/mom how feedings and supplementing going. Mom stated he is doing much better. She is starting to get some colostrum when she pumps. Mom is supplementing w/DBM. encouraged mom to increase supplemental amount. Mom stated she gave 20 ml last feeding. LC gave mom supplemental guide amount previous day. Mom feels things are getting better.   Mom states she is tired. Encouraged to rest while baby is sleeping. Encouraged to call for assistance if needed.  Patient Name: Teresa Sanchez Date: 11/30/2019 Reason for consult: Follow-up assessment;Term   Maternal Data    Feeding Feeding Type: Donor Breast Milk Nipple Type: Dr. Cline Crock  LATCH Score                   Interventions    Lactation Tools Discussed/Used     Consult Status Consult Status: Follow-up Date: 11/30/19 Follow-up type: In-patient    Charyl Dancer 11/30/2019, 2:39 AM

## 2019-11-30 NOTE — Discharge Summary (Signed)
Postpartum Discharge Summary  Patient Name: Teresa Sanchez DOB: [birth year] MRN: [id number]  Date of admission: 11/27/2019 Delivery date:11/27/2019  Delivering provider: Janet Berlin  Date of discharge: 11/30/2019  Admitting diagnosis: Post-dates pregnancy [O48.0] Intrauterine pregnancy: [redacted]w[redacted]d    Secondary diagnosis:  Active Problems:   Encounter for supervision of high risk multigravida of advanced maternal age, antepartum   Maternal obesity affecting pregnancy, antepartum   Previous cesarean delivery affecting pregnancy, antepartum   AMA (advanced maternal age) multigravida 35+   Preterm premature rupture of membranes (PPROM) with unknown onset of labor   GBS (group B Streptococcus carrier), +RV culture, currently pregnant   Post-dates pregnancy   Cesarean delivery delivered  Additional problems: none                            Discharge diagnosis: Term Pregnancy Delivered                                              Post partum procedures:none Augmentation: N/A Complications: None  Hospital course: Onset of Labor With Unplanned C/S   39y.o. yo G3P1011 at 449w5das admitted in Latent Labor on 11/27/2019. Patient had a labor course significant for presenting to [facility] in latent labor with initial SVE 1/50/-3. Her FHT demonstrated a baseline of 180 with recurrent deep variables. Due to NRLeoladecision was made to proceed with rLTCS. The patient went for cesarean section due to Non-Reassuring FHR. Delivery details as follows: Membrane Rupture Time/Date: 2:43 PM ,11/27/2019   Delivery Method:C-Section, Low Transverse  Details of operation can be found in separate operative note. Patient had an uncomplicated postpartum course.  She is ambulating,tolerating a regular diet, passing flatus, and urinating well.  Patient is discharged home in stable condition 11/29/19.  Newborn Data: Birth date:11/27/2019  Birth time:3:49 PM  Gender:Female  Living status:Living  Apgars:7 ,8   Weight:3180 g   Magnesium Sulfate received: No BMZ received: No Rhophylac:N/A MMR:No T-DaP:Given prenatally Flu: No Transfusion:No  Physical exam        Vitals:   11/28/19 0826 11/28/19 1617 11/28/19 2213 11/29/19 0516  BP: 104/70 117/72 107/71 114/79  Pulse:   86 75  Resp: _0 Temp: 98.8 F (37.1 C) 98.1 F (36.7 C) 98 F (36.7 C) 97.9 F (36.6 C)  TempSrc:   Oral Oral  SpO2: 99% 99% 100% 100%  Weight:       General: alert, cooperative and no distress Lochia: appropriate Uterine Fundus: firm Incision: Healing well with no significant drainage, Dressing is clean, dry, and intact DVT Evaluation: No evidence of DVT seen on physical exam. Labs: Recent Labs       Lab Results  Component Value Date   WBC 19.0 (H) 11/28/2019   HGB 8.4 (L) 11/28/2019   HCT 25.7 (L) 11/28/2019   MCV 97.3 11/28/2019   PLT 238 11/28/2019     CMP Latest Ref Rng & Units 11/28/2019  Creatinine 0.44 - 1.00 mg/dL 0.85   Edinburgh Score: Edinburgh Postnatal Depression Scale Screening Tool 11/28/2019  I have been able to laugh and see the funny side of things. 0  I have looked forward with enjoyment to things. 0  I have blamed myself unnecessarily when things went wrong. 1  I have been anxious or worried for no good reason.  0  I have felt scared or panicky for no good reason. 0  Things have been getting on top of me. 0  I have been so unhappy that I have had difficulty sleeping. 0  I have felt sad or miserable. 0  I have been so unhappy that I have been crying. 0  The thought of harming myself has occurred to me. 0  Edinburgh Postnatal Depression Scale Total 1     After visit meds:  Allergies as of 11/29/2019   No Known Allergies        Medication List    STOP taking these medications   aspirin EC 81 MG tablet     TAKE these medications   Blood Pressure Kit Devi 1 Device by Does not apply route as needed.   ferrous sulfate 325 (65 FE) MG  tablet Take 325 mg by mouth daily with breakfast.   guaiFENesin 600 MG 12 hr tablet Commonly known as: MUCINEX Take 600 mg by mouth 2 (two) times daily as needed for cough.   ibuprofen 800 MG tablet Commonly known as: ADVIL Take 1 tablet (800 mg total) by mouth every 6 (six) hours.   MAGNESIUM PO Take 1 tablet by mouth daily.   oxyCODONE 5 MG immediate release tablet Commonly known as: Oxy IR/ROXICODONE Take 1 tablet (5 mg total) by mouth every 4 (four) hours as needed for moderate pain.   phenylephrine 10 MG Tabs tablet Commonly known as: SUDAFED PE Take 10 mg by mouth every 4 (four) hours as needed (congestion).   PNV PO Take 1 tablet by mouth daily.   PROBIOTIC PO Take 1 capsule by mouth daily.   VITAMIN C PO Take 1 tablet by mouth daily.   VITAMIN D PO Take 1 capsule by mouth daily.   VITAMIN E PO Take 1 tablet by mouth daily.        Discharge home in stable condition Infant Feeding: Breast Infant Disposition:home with mother Discharge instruction: per After Visit Summary and Postpartum booklet. Activity: Advance as tolerated. Pelvic rest for 6 weeks.  Diet: routine diet Future Appointments:       Future Appointments  Date Time Provider [place]  12/06/2019  1:20 PM [place] None  12/25/2019 10:30 AM Constant, Peggy, MD [place] None   Follow up Visit:      Nance. Schedule an appointment as soon as possible for a visit in 1 week(s).   Specialty: Obstetrics and Gynecology Contact information: [address] [place] [phone]                Please schedule this patient for a In person postpartum visit in 6 weeks with the following provider: Any provider. Additional Postpartum F/U:Incision check 1 week  High risk pregnancy complicated by: repeat LTCS, hx of smoking, hx of LEEP  Delivery mode:  C-Section, Low Transverse  Anticipated Birth Control: Derrek Gu, MD OB [facility] 11/30/2019 3:28 AM

## 2019-12-06 ENCOUNTER — Other Ambulatory Visit: Payer: Self-pay

## 2019-12-06 ENCOUNTER — Ambulatory Visit (INDEPENDENT_AMBULATORY_CARE_PROVIDER_SITE_OTHER): Payer: Medicaid Other

## 2019-12-06 VITALS — BP 124/85 | HR 80 | Wt 298.0 lb

## 2019-12-06 DIAGNOSIS — Z5189 Encounter for other specified aftercare: Secondary | ICD-10-CM

## 2019-12-06 NOTE — Progress Notes (Signed)
Subjective:     Teresa Sanchez is a 39 y.o. female who presents to the clinic 1 weeks status post LTCS for Incision check. Eating a regular diet without difficulty. Bowel movements are normal. The patient is not having any pain.  Review of Systems Pertinent items are noted in HPI.    Objective:    BP 124/85   Pulse 80   Wt 298 lb (135.2 kg)   Breastfeeding Yes   BMI 42.76 kg/m  General:  alert  Abdomen: soft, bowel sounds active, non-tender  Incision:   healing well, no drainage, no erythema, no hernia, no seroma, no swelling, no dehiscence, incision well approximated     Assessment:    Doing well postoperatively.   Plan:    1. Continue any current medications. 2. Wound care discussed. 3. Activity restrictions: none 4. Anticipated return to work: not applicable. 5. Follow up:3 weeks for PP visit.   Maretta Bees, RMA

## 2019-12-25 ENCOUNTER — Other Ambulatory Visit: Payer: Self-pay

## 2019-12-25 ENCOUNTER — Encounter: Payer: Self-pay | Admitting: Obstetrics and Gynecology

## 2019-12-25 ENCOUNTER — Ambulatory Visit (INDEPENDENT_AMBULATORY_CARE_PROVIDER_SITE_OTHER): Payer: Medicaid Other | Admitting: Obstetrics and Gynecology

## 2019-12-25 DIAGNOSIS — Z01812 Encounter for preprocedural laboratory examination: Secondary | ICD-10-CM

## 2019-12-25 DIAGNOSIS — Z30017 Encounter for initial prescription of implantable subdermal contraceptive: Secondary | ICD-10-CM

## 2019-12-25 LAB — POCT URINE PREGNANCY: Preg Test, Ur: NEGATIVE

## 2019-12-25 MED ORDER — ETONOGESTREL 68 MG ~~LOC~~ IMPL
68.0000 mg | DRUG_IMPLANT | Freq: Once | SUBCUTANEOUS | Status: AC
  Administered 2019-12-25: 68 mg via SUBCUTANEOUS

## 2019-12-25 NOTE — Progress Notes (Signed)
Post Partum Visit Note  Teresa Sanchez is a 39 y.o. G32P1011 female who presents for a postpartum visit. She is 4 weeks postpartum following a repeat cesarean section.  I have fully reviewed the prenatal and intrapartum course. The delivery was at 40 gestational weeks.  Anesthesia: spinal. Postpartum course has been unremarkable. Baby is doing well. Baby is feeding by breast. Bleeding no bleeding. Bowel function is normal. Bladder function is normal. Patient is not sexually active. Contraception method is Nexplanon. Postpartum depression screening: negative. Score 0   The pregnancy intention screening data noted above was reviewed. Potential methods of contraception were discussed. The patient elected to proceed with Hormonal Implant.    Edinburgh Postnatal Depression Scale - 12/25/19 1041      Edinburgh Postnatal Depression Scale:  In the Past 7 Days   I have been able to laugh and see the funny side of things. 0    I have looked forward with enjoyment to things. 0    I have blamed myself unnecessarily when things went wrong. 0    I have been anxious or worried for no good reason. 0    I have felt scared or panicky for no good reason. 0    Things have been getting on top of me. 0    I have been so unhappy that I have had difficulty sleeping. 0    I have felt sad or miserable. 0    I have been so unhappy that I have been crying. 0    The thought of harming myself has occurred to me. 0    Edinburgh Postnatal Depression Scale Total 0               Review of Systems Pertinent items noted in HPI and remainder of comprehensive ROS otherwise negative.    Objective:  Blood pressure 117/82, pulse 82, weight 295 lb (133.8 kg), currently breastfeeding.  General:  alert, cooperative and no distress   Breasts:  inspection negative, no nipple discharge or bleeding, no masses or nodularity palpable  Lungs: clear to auscultation bilaterally  Heart:  regular rate and rhythm  Abdomen:  soft, non-tender; bowel sounds normal; no masses,  no organomegaly   Vulva:  normal  Vagina: normal vagina, no discharge, exudate, lesion, or erythema  Cervix:  multiparous appearance  Corpus: normal size, contour, position, consistency, mobility, non-tender  Adnexa:  normal adnexa  Rectal Exam: Not performed.        Assessment:    Normal postpartum exam. Pap smear not done at today's visit.   Plan:   Essential components of care per ACOG recommendations:  1.  Mood and well being: Patient with negative depression screening today. Reviewed local resources for support.  - Patient does not use tobacco.  - hx of drug use? No    2. Infant care and feeding:  -Patient currently breastmilk feeding? Yes Reviewed importance of draining breast regularly to support lactation. -Social determinants of health (SDOH) reviewed in EPIC. No concerns  3. Sexuality, contraception and birth spacing - Patient does not want a pregnancy in the next year.   - Reviewed forms of contraception in tiered fashion. Patient desired Nexplanon today.   - Patient given informed consent, signed copy in the chart, time out was performed. Pregnancy test was negative. Appropriate time out taken.  Patient's left arm was prepped and draped in the usual sterile fashion.. The ruler used to measure and mark insertion area.  Patient was prepped with  alcohol swab and then injected with 2 cc of 1% lidocaine with epinephrine.  Patient was prepped with betadine, Nexplanon removed form packaging.  Device confirmed in needle, then inserted full length of needle and withdrawn per handbook instructions.  Patient insertion site covered with a band-aid and [place].   Minimal blood loss.  Patient tolerated the procedure well.    4. Sleep and fatigue -Encouraged family/partner/community support of 4 hrs of uninterrupted sleep to help with mood and fatigue  5. Physical Recovery  - Discussed patients delivery and complications - Patient is  safe to resume physical and sexual activity  6.  Health Maintenance - Last pap smear done 04/24/19 and was normal with negative HPV.    Catalina Antigua, MD [facility] [employer], [facility]

## 2020-01-10 ENCOUNTER — Emergency Department (HOSPITAL_COMMUNITY)
Admission: EM | Admit: 2020-01-10 | Discharge: 2020-01-11 | Disposition: A | Discharge status: Home / Self Care | Payer: Medicaid Other | Attending: Emergency Medicine | Admitting: Emergency Medicine

## 2020-01-10 ENCOUNTER — Other Ambulatory Visit: Payer: Self-pay

## 2020-01-10 ENCOUNTER — Encounter (HOSPITAL_COMMUNITY): Payer: Self-pay | Admitting: *Deleted

## 2020-01-10 DIAGNOSIS — R519 Headache, unspecified: Secondary | ICD-10-CM | POA: Diagnosis not present

## 2020-01-10 DIAGNOSIS — Z5321 Procedure and treatment not carried out due to patient leaving prior to being seen by health care provider: Secondary | ICD-10-CM | POA: Diagnosis not present

## 2020-01-10 LAB — CBC
HCT: 37.3 % (ref 36.0–46.0)
Hemoglobin: 11.5 g/dL — ABNORMAL LOW (ref 12.0–15.0)
MCH: 29.7 pg (ref 26.0–34.0)
MCHC: 30.8 g/dL (ref 30.0–36.0)
MCV: 96.4 fL (ref 80.0–100.0)
Platelets: 339 10*3/uL (ref 150–400)
RBC: 3.87 MIL/uL (ref 3.87–5.11)
RDW: 12 % (ref 11.5–15.5)
WBC: 7.1 10*3/uL (ref 4.0–10.5)
nRBC: 0 % (ref 0.0–0.2)

## 2020-01-10 LAB — COMPREHENSIVE METABOLIC PANEL
ALT: 13 U/L (ref 0–44)
AST: 15 U/L (ref 15–41)
Albumin: 3.6 g/dL (ref 3.5–5.0)
Alkaline Phosphatase: 103 U/L (ref 38–126)
Anion gap: 10 (ref 5–15)
BUN: 10 mg/dL (ref 6–20)
CO2: 24 mmol/L (ref 22–32)
Calcium: 9.4 mg/dL (ref 8.9–10.3)
Chloride: 104 mmol/L (ref 98–111)
Creatinine, Ser: 0.77 mg/dL (ref 0.44–1.00)
GFR, Estimated: >60 mL/min (ref >60)
Glucose, Bld: 114 mg/dL — ABNORMAL HIGH (ref 70–99)
Potassium: 3.8 mmol/L (ref 3.5–5.1)
Sodium: 138 mmol/L (ref 135–145)
Total Bilirubin: 0.2 mg/dL — ABNORMAL LOW (ref 0.3–1.2)
Total Protein: 8 g/dL (ref 6.5–8.1)

## 2020-01-10 NOTE — ED Triage Notes (Signed)
Headache since last night  She delivered her baby 6 weeks ago  She was taking her bp and it was high at home but not here   lmp now

## 2020-05-16 DIAGNOSIS — H5213 Myopia, bilateral: Secondary | ICD-10-CM | POA: Diagnosis not present

## 2021-06-08 IMAGING — US US OB COMP LESS 14 WK
1 series · 15 of 28 positions shown · non-contrast
Comparison: None

CLINICAL DATA: Encounter for supervision of high risk multigravida
first trimester pregnancy, advanced maternal age

EXAM:
OBSTETRIC <14 WK ULTRASOUND
TECHNIQUE: Transabdominal ultrasound was performed for evaluation of the
gestation as well as the maternal uterus and adnexal regions.

[Series 1: us ob comp less 14 wk · 15 of 61 slices shown]
[im 1/61]
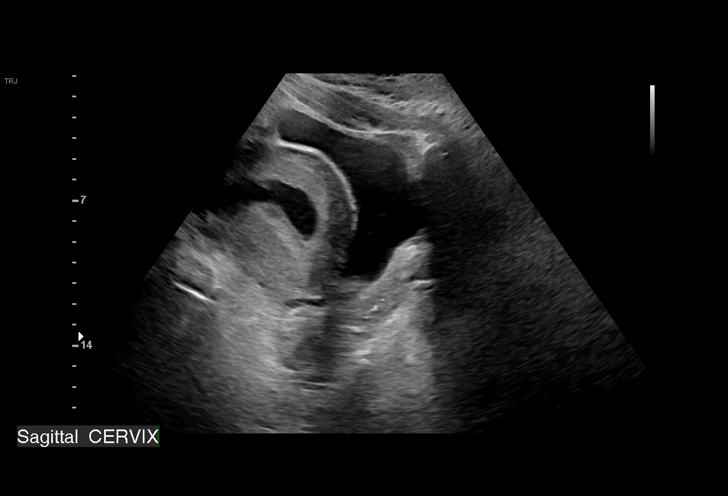
[im 5/61]
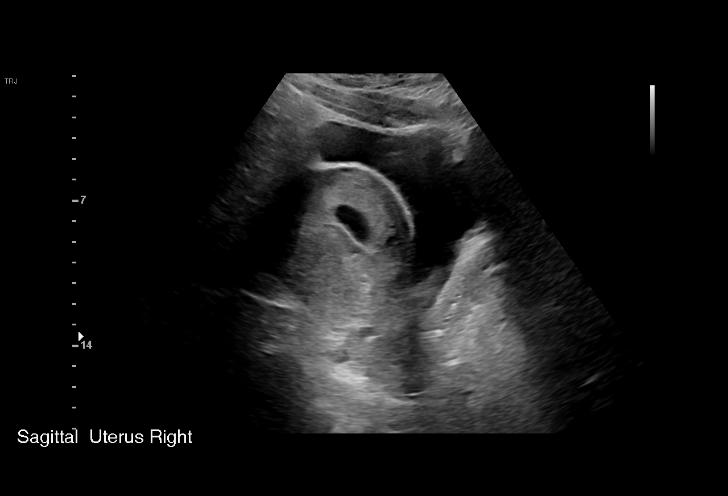
[im 9/61]
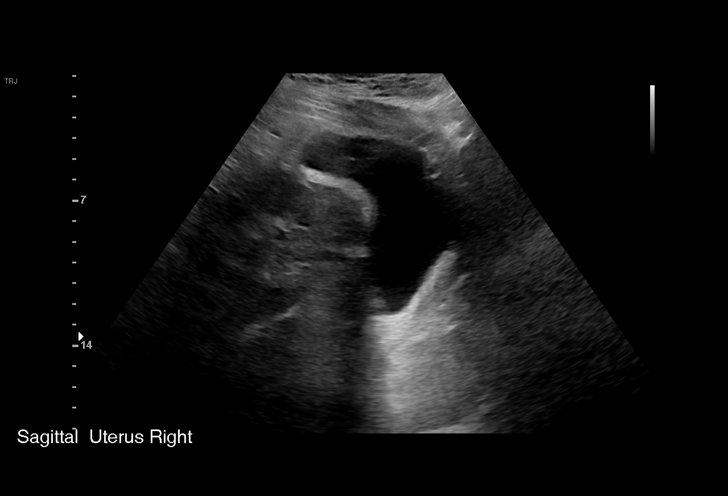
[im 14/61]
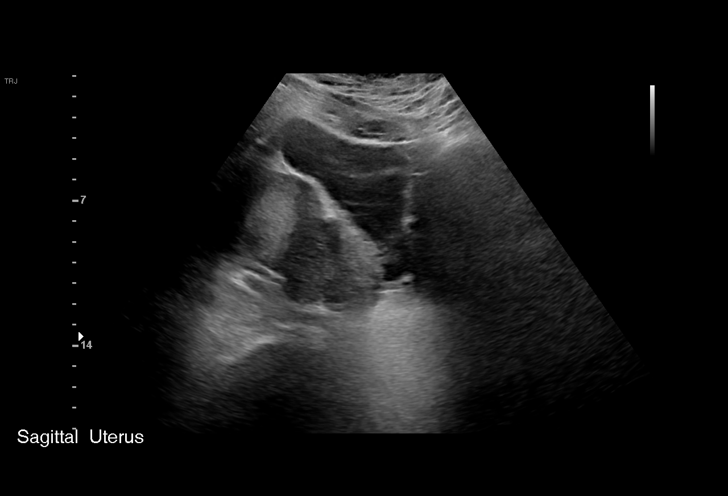
[im 18/61]
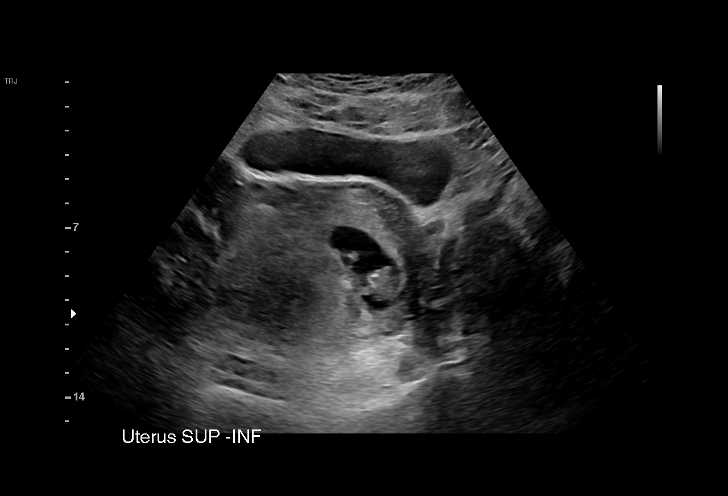
[im 23/61]
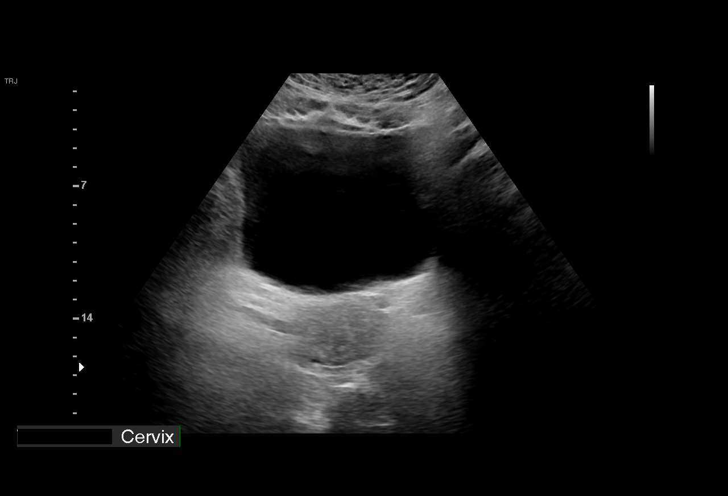
[im 27/61]
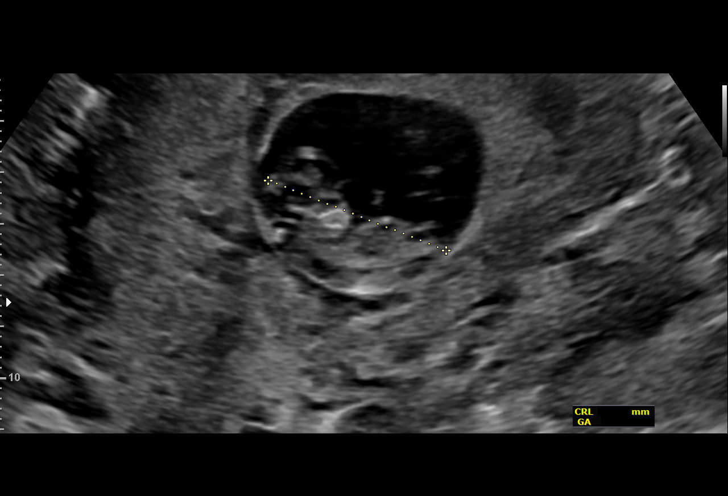
[im 32/61]
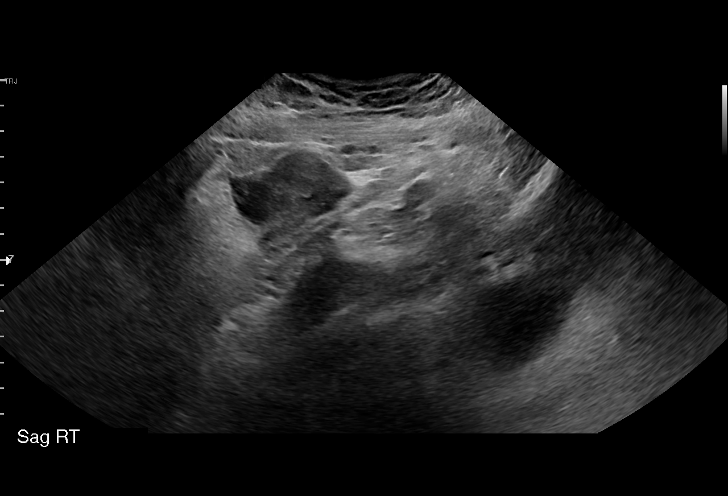
[im 34/61]
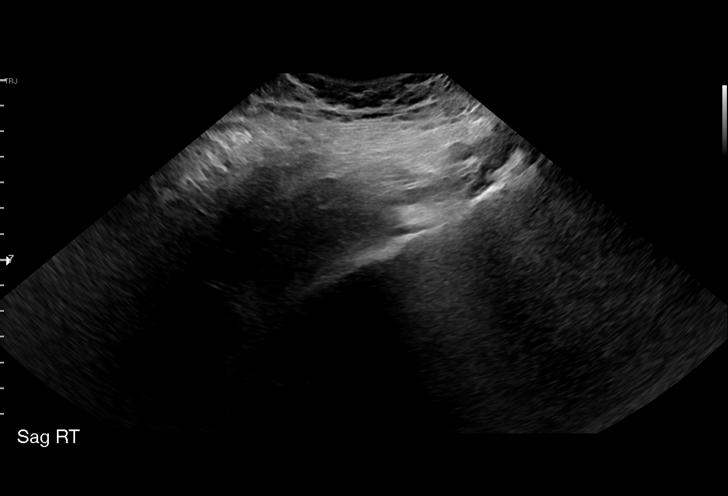
[im 38/61]
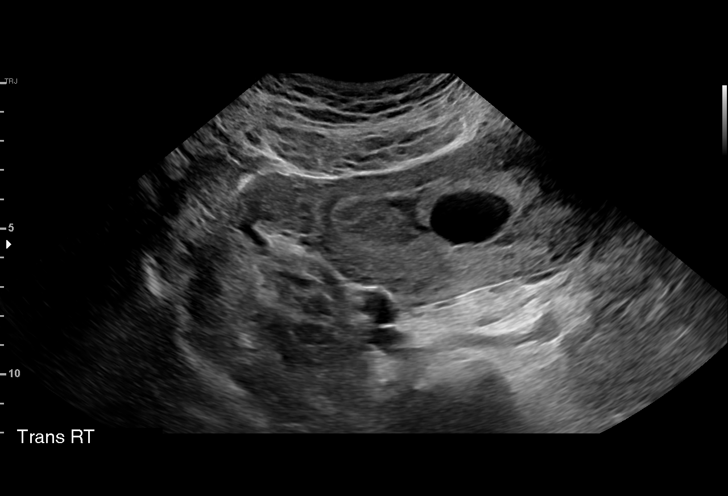
[im 43/61]
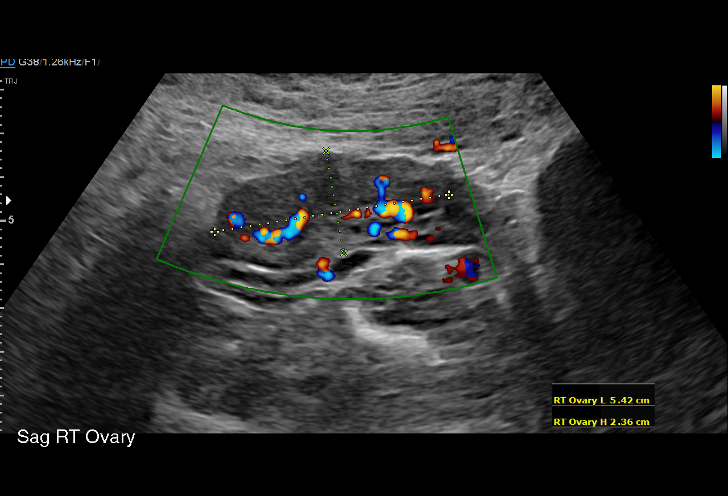
[im 47/61]
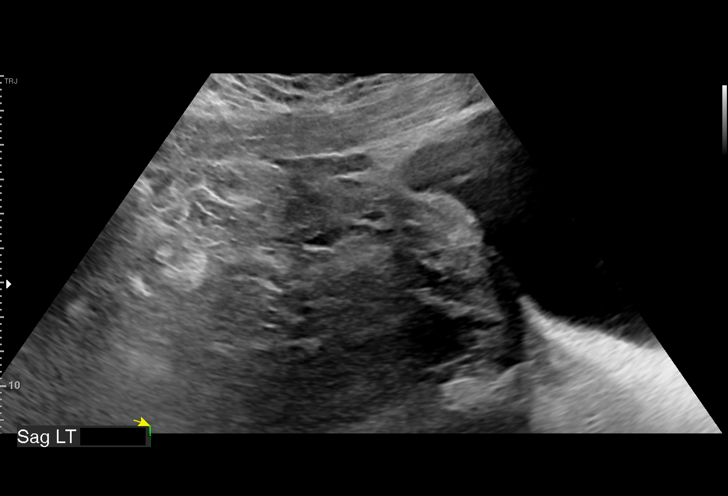
[im 52/61]
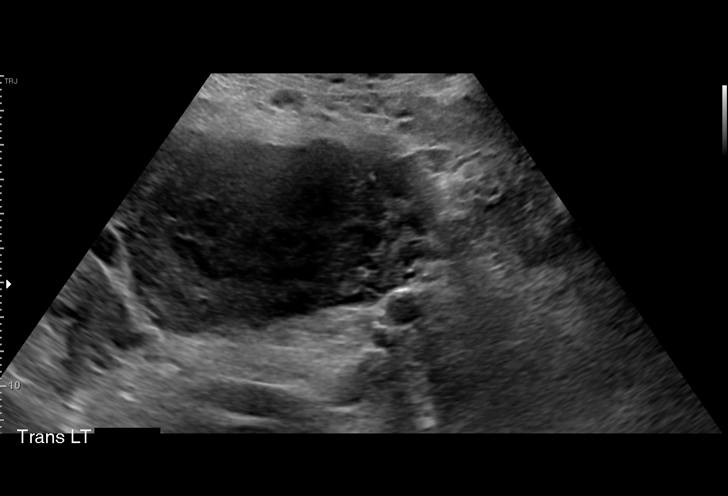
[im 56/61]
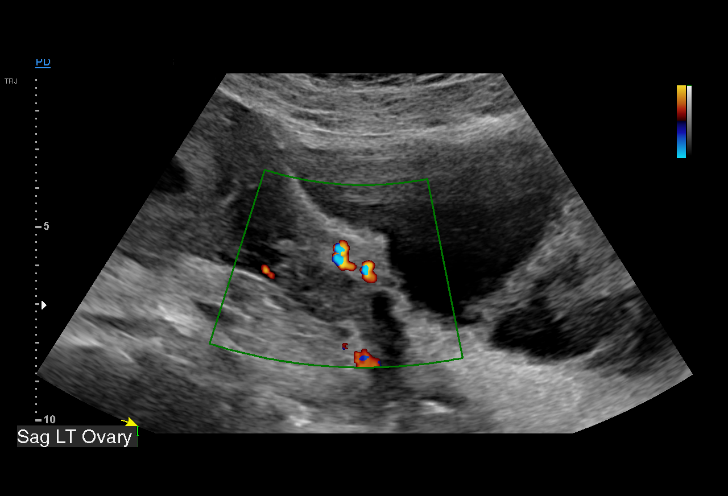
[im 61/61]
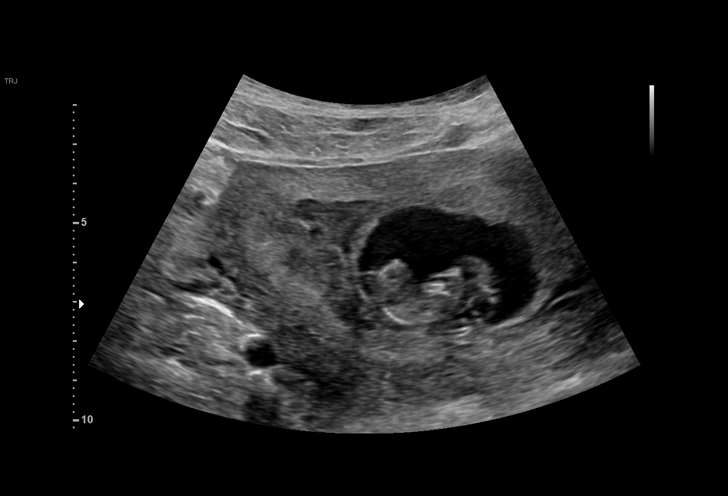

[15 of 28 positions shown; findings below may reference images not displayed]

FINDINGS: Intrauterine gestational sac: Present, single

Yolk sac:  Present

Embryo:  Present

Cardiac Activity: Present

Heart Rate: 167 bpm

CRL:   38.8 mm   10 w 5 d                  US EDC: 11/22/2019

Subchorionic hemorrhage:  None visualized.

Maternal uterus/adnexae:

LEFT ovary normal size and morphology 2.2 x 1.4 x 2.0 cm.

RIGHT ovary normal size and morphology, 5.4 x 2.4 x 2.2 cm,
containing a small corpus luteum.

No free pelvic fluid or adnexal masses.
IMPRESSION: Single live intrauterine gestation at 10 weeks 5 days EGA by
crown-rump length.

No acute abnormalities.

## 2021-07-14 DIAGNOSIS — Z1159 Encounter for screening for other viral diseases: Secondary | ICD-10-CM | POA: Diagnosis not present

## 2021-07-14 DIAGNOSIS — Z0000 Encounter for general adult medical examination without abnormal findings: Secondary | ICD-10-CM | POA: Diagnosis not present

## 2021-07-14 DIAGNOSIS — D539 Nutritional anemia, unspecified: Secondary | ICD-10-CM | POA: Diagnosis not present

## 2021-07-14 DIAGNOSIS — Z20822 Contact with and (suspected) exposure to covid-19: Secondary | ICD-10-CM | POA: Diagnosis not present

## 2021-07-14 DIAGNOSIS — R8290 Unspecified abnormal findings in urine: Secondary | ICD-10-CM | POA: Diagnosis not present

## 2021-07-14 DIAGNOSIS — E559 Vitamin D deficiency, unspecified: Secondary | ICD-10-CM | POA: Diagnosis not present

## 2021-07-14 DIAGNOSIS — R5383 Other fatigue: Secondary | ICD-10-CM | POA: Diagnosis not present

## 2021-07-28 DIAGNOSIS — R942 Abnormal results of pulmonary function studies: Secondary | ICD-10-CM | POA: Diagnosis not present

## 2021-09-01 IMAGING — US US MFM OB LIMITED
1 series · 14 of 24 positions shown · non-contrast
Comparison: none

[Series 1: us mfm ob limited · 24 acquisitions, 14 frames shown]
[im 1/24]
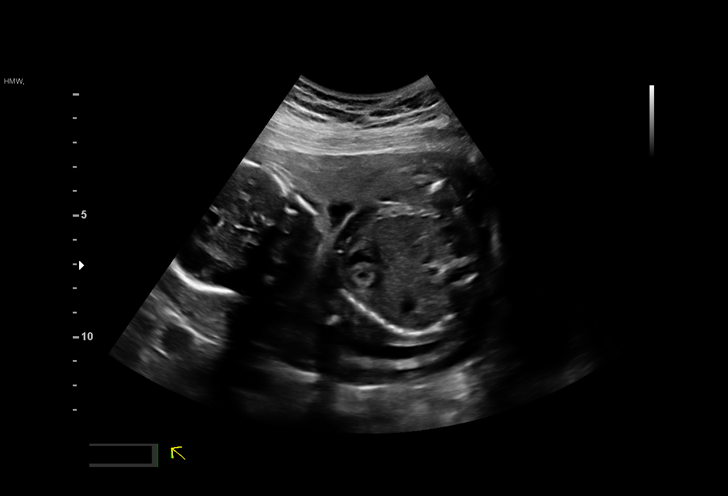
[im 3/24]
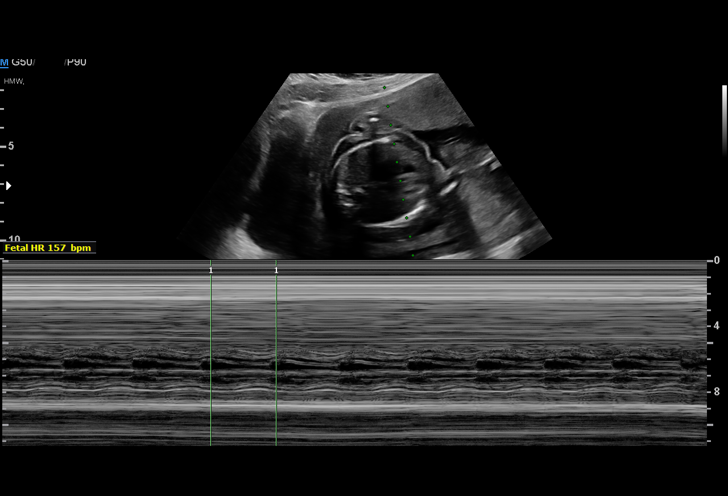
[im 5/24]
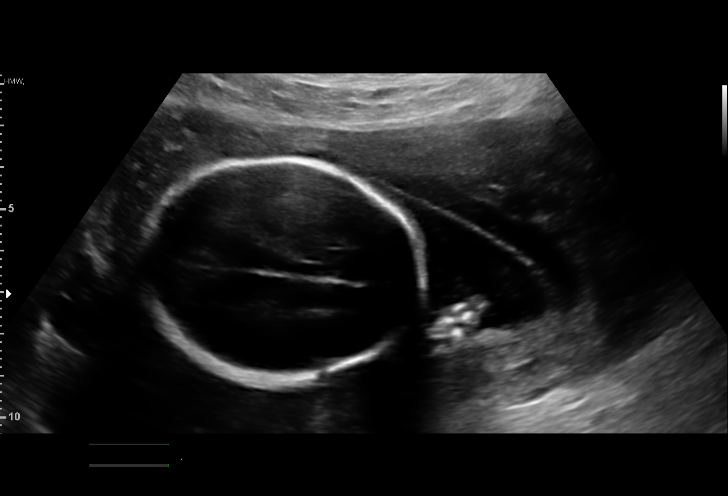
[im 7/24]
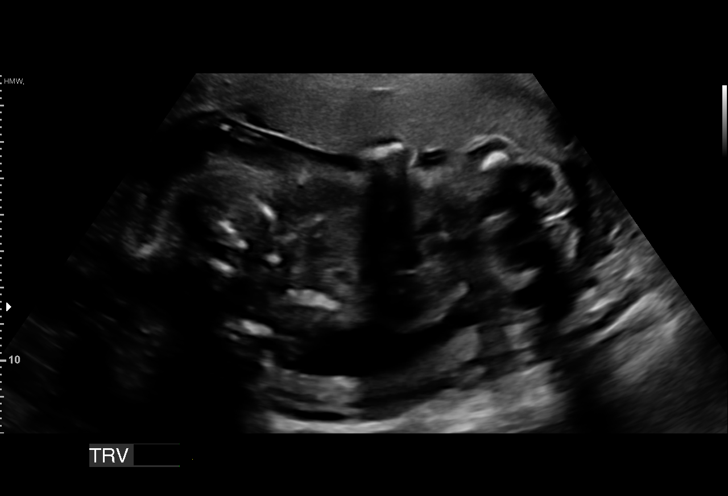
[im 8/24]
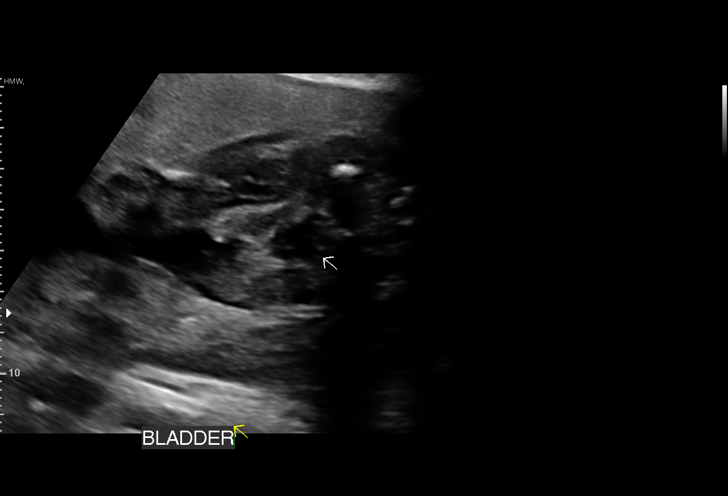
[im 10/24]
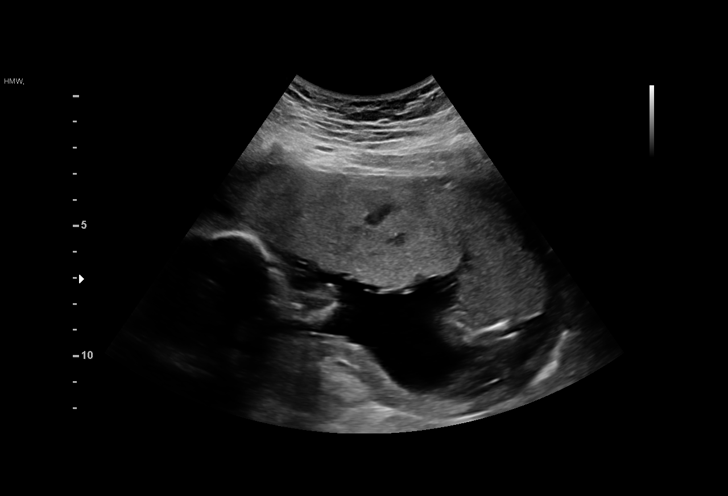
[im 12/24]
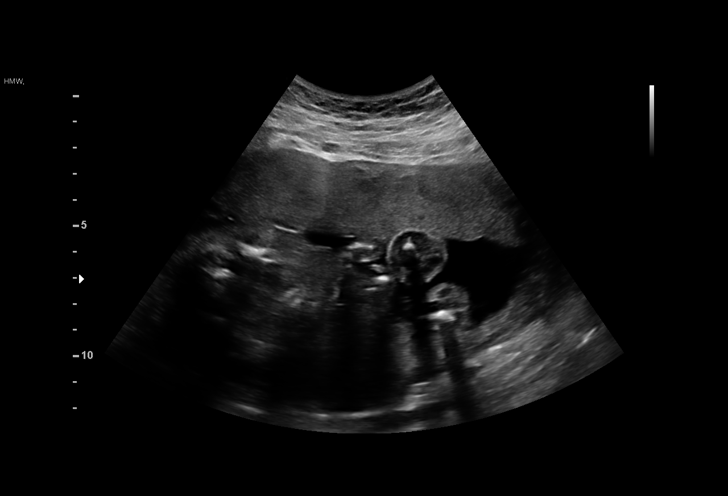
[im 13/24]
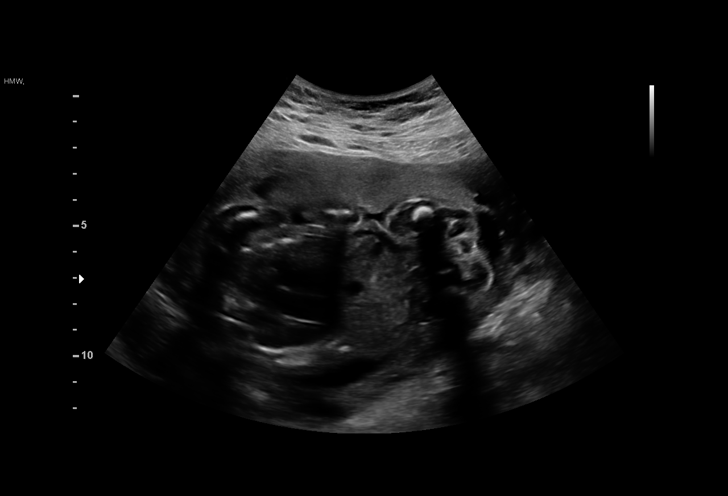
[im 15/24]
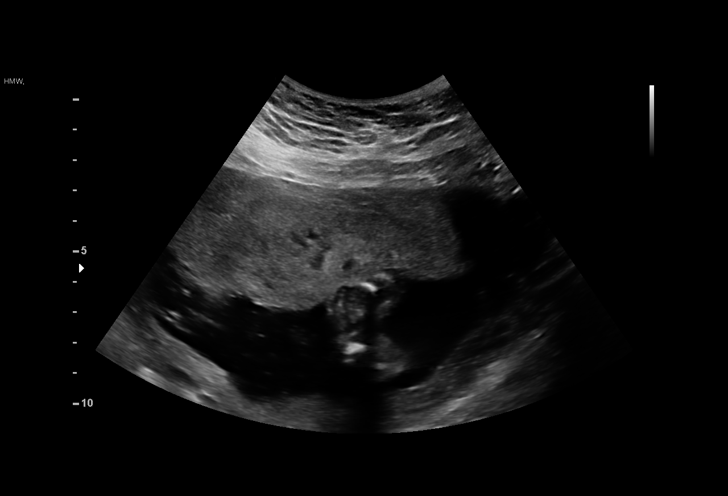
[im 17/24]
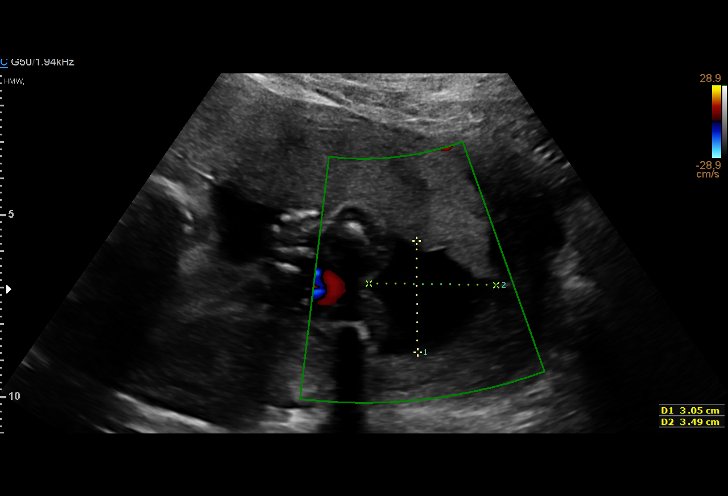
[im 19/24]
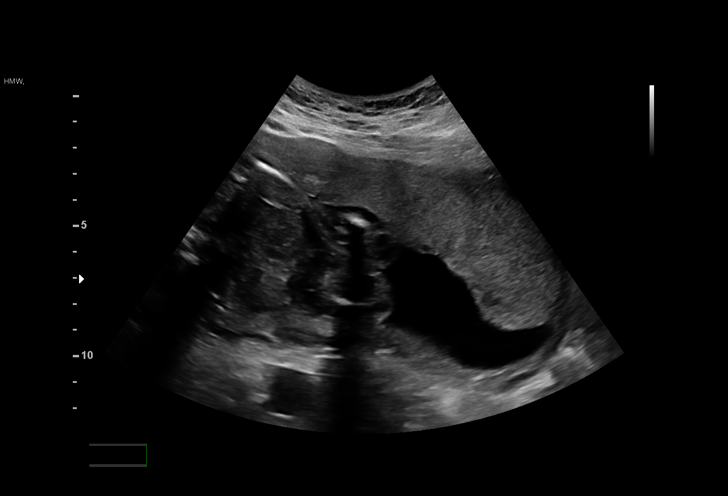
[im 20/24]
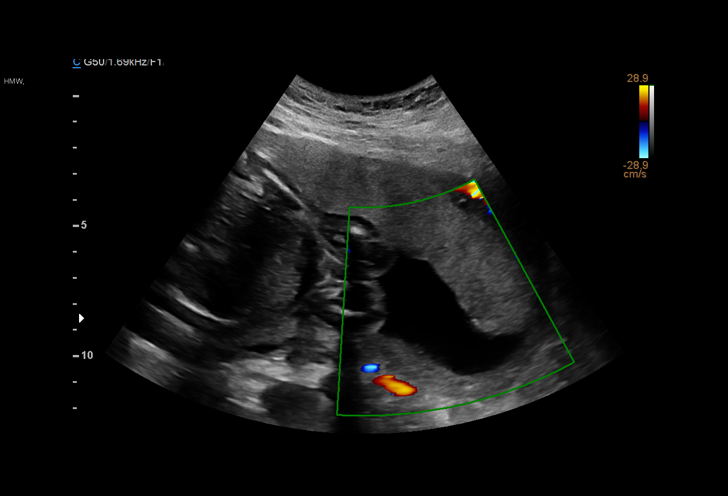
[im 22/24]
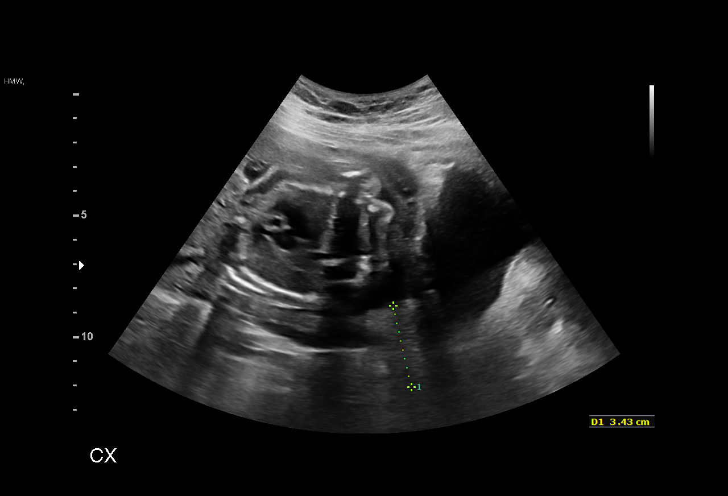
[im 24/24]
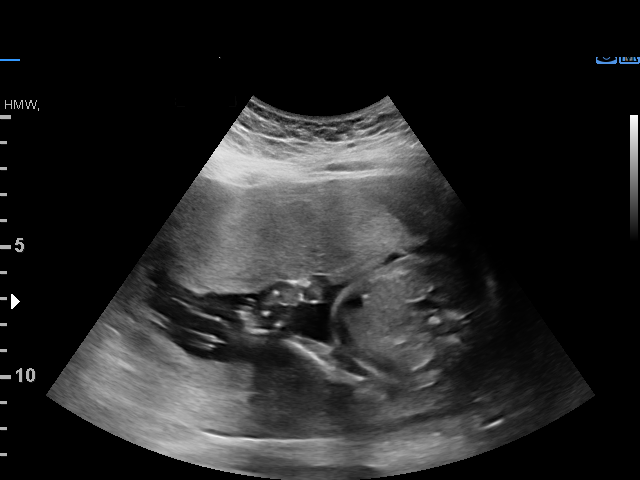

[14 of 24 positions shown; findings below may reference images not displayed]

[facility]                                 [[facility]OSP[facility]]

 ----------------------------------------------------------------------

 ----------------------------------------------------------------------
Indications

  Premature rupture of membranes - leaking
  fluid
  22 weeks gestation of pregnancy
  Advanced maternal age multigravida 35+,
  second trimester
  Obesity complicating pregnancy, second
  trimester (low risk NIPS)
  Previous cesarean delivery, antepartum
  Encounter for antenatal screening,
  unspecified
 ----------------------------------------------------------------------
Fetal Evaluation

 Num Of Fetuses:         1
 Fetal Heart Rate(bpm):  157
 Cardiac Activity:       Observed
 Presentation:           Transverse, head to maternal right
 Placenta:               Anterior
 P. Cord Insertion:      Previously Visualized

 Amniotic Fluid
 AFI FV:      Subjectively decreased

                             Largest Pocket(cm)

OB History

 Blood Type:    A+
 Gravidity:    3         Term:   1        Prem:   0        SAB:   0
 TOP:          1       Ectopic:  0        Living: 1
Gestational Age

 LMP:           23w 5d        Date:  02/09/19                 EDD:   11/16/19
 Clinical EDD:  22w 6d                                        EDD:   11/22/19
 Best:          22w 6d     Det. By:  Early Ultrasound         EDD:   11/22/19
                                     (05/01/19)
Anatomy

 Thoracic:              Appears normal         Bladder:                Appears normal
 Stomach:               Appears normal, left
                        sided
Cervix Uterus Adnexa

 Cervix
 Length:            3.4  cm.
 Normal appearance by transabdominal scan.

 Uterus
 No abnormality visualized.

 Left Ovary
 No adnexal mass visualized.

 Right Ovary
 No adnexal mass visualized.

 Cul De Sac
 No free fluid seen.

 Adnexa
 No abnormality visualized.
Impression

 Patient has a diagnosis of PPROM at 18 weeks gestation.
 Patient reports she has not had leakage of amniotic fluid
 since her first episode.  On follow-up ultrasound performed
 on 07/14/2019, normal amniotic fluid was seen.  She has no
 symptoms of fever or abdominal pain.
 On today's ultrasound, amniotic fluid is subjectively
 decreased.  But the maximum vertical pocket is normal (3
 cm).  Good fetal activity is present.
 I explained the findings.  Patient strongly desires outpatient
 management.  Given that she has no symptoms of leakage of
 amniotic fluid, outpatient management is reasonable.
Recommendations

 -An appointment was made for her to return in 2 weeks for
 fetal growth and amniotic fluid assessment.
 -Amniotic fluid assessments every 2 weeks till 28 weeks
 gestation.
                 Tacuri, Waldemar

## 2021-09-02 DIAGNOSIS — R9431 Abnormal electrocardiogram [ECG] [EKG]: Secondary | ICD-10-CM | POA: Diagnosis not present

## 2021-09-11 DIAGNOSIS — K9049 Malabsorption due to intolerance, not elsewhere classified: Secondary | ICD-10-CM | POA: Diagnosis not present

## 2021-09-11 DIAGNOSIS — R748 Abnormal levels of other serum enzymes: Secondary | ICD-10-CM | POA: Diagnosis not present

## 2021-09-11 DIAGNOSIS — R9431 Abnormal electrocardiogram [ECG] [EKG]: Secondary | ICD-10-CM | POA: Diagnosis not present

## 2021-09-12 DIAGNOSIS — E0590 Thyrotoxicosis, unspecified without thyrotoxic crisis or storm: Secondary | ICD-10-CM | POA: Diagnosis not present

## 2021-09-12 DIAGNOSIS — R748 Abnormal levels of other serum enzymes: Secondary | ICD-10-CM | POA: Diagnosis not present

## 2021-10-03 DIAGNOSIS — R748 Abnormal levels of other serum enzymes: Secondary | ICD-10-CM | POA: Diagnosis not present

## 2021-10-03 DIAGNOSIS — R945 Abnormal results of liver function studies: Secondary | ICD-10-CM | POA: Diagnosis not present

## 2021-10-03 DIAGNOSIS — R197 Diarrhea, unspecified: Secondary | ICD-10-CM | POA: Diagnosis not present

## 2021-10-06 DIAGNOSIS — J302 Other seasonal allergic rhinitis: Secondary | ICD-10-CM | POA: Diagnosis not present

## 2021-10-06 DIAGNOSIS — E0590 Thyrotoxicosis, unspecified without thyrotoxic crisis or storm: Secondary | ICD-10-CM | POA: Diagnosis not present

## 2021-10-06 DIAGNOSIS — L309 Dermatitis, unspecified: Secondary | ICD-10-CM | POA: Diagnosis not present

## 2021-10-06 DIAGNOSIS — E739 Lactose intolerance, unspecified: Secondary | ICD-10-CM | POA: Diagnosis not present

## 2021-10-06 DIAGNOSIS — K5229 Other allergic and dietetic gastroenteritis and colitis: Secondary | ICD-10-CM | POA: Diagnosis not present

## 2021-10-10 DIAGNOSIS — Z1211 Encounter for screening for malignant neoplasm of colon: Secondary | ICD-10-CM | POA: Diagnosis not present

## 2021-10-10 DIAGNOSIS — R197 Diarrhea, unspecified: Secondary | ICD-10-CM | POA: Diagnosis not present

## 2021-10-18 DIAGNOSIS — K635 Polyp of colon: Secondary | ICD-10-CM | POA: Diagnosis not present

## 2021-11-11 DIAGNOSIS — R748 Abnormal levels of other serum enzymes: Secondary | ICD-10-CM | POA: Diagnosis not present

## 2021-11-11 DIAGNOSIS — E0590 Thyrotoxicosis, unspecified without thyrotoxic crisis or storm: Secondary | ICD-10-CM | POA: Diagnosis not present

## 2021-11-11 DIAGNOSIS — R197 Diarrhea, unspecified: Secondary | ICD-10-CM | POA: Diagnosis not present

## 2021-11-21 ENCOUNTER — Encounter (HOSPITAL_COMMUNITY): Payer: Self-pay | Admitting: *Deleted

## 2021-12-24 DIAGNOSIS — N951 Menopausal and female climacteric states: Secondary | ICD-10-CM | POA: Diagnosis not present

## 2021-12-24 DIAGNOSIS — Z20822 Contact with and (suspected) exposure to covid-19: Secondary | ICD-10-CM | POA: Diagnosis not present

## 2021-12-24 DIAGNOSIS — Z3200 Encounter for pregnancy test, result unknown: Secondary | ICD-10-CM | POA: Diagnosis not present

## 2021-12-24 DIAGNOSIS — R5383 Other fatigue: Secondary | ICD-10-CM | POA: Diagnosis not present

## 2021-12-24 DIAGNOSIS — F17210 Nicotine dependence, cigarettes, uncomplicated: Secondary | ICD-10-CM | POA: Diagnosis not present

## 2021-12-24 DIAGNOSIS — E559 Vitamin D deficiency, unspecified: Secondary | ICD-10-CM | POA: Diagnosis not present

## 2021-12-24 DIAGNOSIS — Z131 Encounter for screening for diabetes mellitus: Secondary | ICD-10-CM | POA: Diagnosis not present

## 2021-12-24 DIAGNOSIS — Z13220 Encounter for screening for lipoid disorders: Secondary | ICD-10-CM | POA: Diagnosis not present

## 2021-12-24 DIAGNOSIS — Z6831 Body mass index (BMI) 31.0-31.9, adult: Secondary | ICD-10-CM | POA: Diagnosis not present

## 2021-12-24 DIAGNOSIS — E611 Iron deficiency: Secondary | ICD-10-CM | POA: Diagnosis not present

## 2021-12-24 DIAGNOSIS — E0590 Thyrotoxicosis, unspecified without thyrotoxic crisis or storm: Secondary | ICD-10-CM | POA: Diagnosis not present

## 2021-12-24 DIAGNOSIS — D539 Nutritional anemia, unspecified: Secondary | ICD-10-CM | POA: Diagnosis not present

## 2022-04-01 DIAGNOSIS — Z13220 Encounter for screening for lipoid disorders: Secondary | ICD-10-CM | POA: Diagnosis not present

## 2022-04-01 DIAGNOSIS — R5383 Other fatigue: Secondary | ICD-10-CM | POA: Diagnosis not present

## 2022-04-01 DIAGNOSIS — E611 Iron deficiency: Secondary | ICD-10-CM | POA: Diagnosis not present

## 2022-04-01 DIAGNOSIS — Z131 Encounter for screening for diabetes mellitus: Secondary | ICD-10-CM | POA: Diagnosis not present

## 2022-04-01 DIAGNOSIS — Z3200 Encounter for pregnancy test, result unknown: Secondary | ICD-10-CM | POA: Diagnosis not present

## 2022-04-01 DIAGNOSIS — E0590 Thyrotoxicosis, unspecified without thyrotoxic crisis or storm: Secondary | ICD-10-CM | POA: Diagnosis not present

## 2022-04-01 DIAGNOSIS — F17210 Nicotine dependence, cigarettes, uncomplicated: Secondary | ICD-10-CM | POA: Diagnosis not present

## 2022-04-01 DIAGNOSIS — Z20822 Contact with and (suspected) exposure to covid-19: Secondary | ICD-10-CM | POA: Diagnosis not present

## 2022-04-01 DIAGNOSIS — E559 Vitamin D deficiency, unspecified: Secondary | ICD-10-CM | POA: Diagnosis not present

## 2022-04-01 DIAGNOSIS — D539 Nutritional anemia, unspecified: Secondary | ICD-10-CM | POA: Diagnosis not present

## 2022-04-01 DIAGNOSIS — E669 Obesity, unspecified: Secondary | ICD-10-CM | POA: Diagnosis not present

## 2022-04-01 DIAGNOSIS — Z6832 Body mass index (BMI) 32.0-32.9, adult: Secondary | ICD-10-CM | POA: Diagnosis not present

## 2022-04-15 DIAGNOSIS — E0590 Thyrotoxicosis, unspecified without thyrotoxic crisis or storm: Secondary | ICD-10-CM | POA: Diagnosis not present

## 2022-04-15 DIAGNOSIS — F17210 Nicotine dependence, cigarettes, uncomplicated: Secondary | ICD-10-CM | POA: Diagnosis not present

## 2022-04-15 DIAGNOSIS — E669 Obesity, unspecified: Secondary | ICD-10-CM | POA: Diagnosis not present

## 2022-04-15 DIAGNOSIS — Z3200 Encounter for pregnancy test, result unknown: Secondary | ICD-10-CM | POA: Diagnosis not present

## 2022-04-15 DIAGNOSIS — E611 Iron deficiency: Secondary | ICD-10-CM | POA: Diagnosis not present

## 2022-04-15 DIAGNOSIS — Z6833 Body mass index (BMI) 33.0-33.9, adult: Secondary | ICD-10-CM | POA: Diagnosis not present

## 2022-04-15 DIAGNOSIS — E559 Vitamin D deficiency, unspecified: Secondary | ICD-10-CM | POA: Diagnosis not present

## 2022-05-08 DIAGNOSIS — E041 Nontoxic single thyroid nodule: Secondary | ICD-10-CM | POA: Diagnosis not present

## 2022-05-08 DIAGNOSIS — E0590 Thyrotoxicosis, unspecified without thyrotoxic crisis or storm: Secondary | ICD-10-CM | POA: Diagnosis not present

## 2022-05-08 DIAGNOSIS — E049 Nontoxic goiter, unspecified: Secondary | ICD-10-CM | POA: Diagnosis not present

## 2022-05-09 ENCOUNTER — Encounter: Payer: Self-pay | Admitting: Obstetrics and Gynecology

## 2022-05-09 NOTE — Progress Notes (Unsigned)
   GYNECOLOGY OFFICE VISIT NOTE  History:   AMIYRAH Sanchez is a 42 y.o. G3P1011 here today for bleeding after her period (2 days after) and after intercourse. It has since resolved. It was one episode. No cramping with it.   She has a Nexplanon that was placed 11/2019   Past Medical History:  Diagnosis Date   Hypertension    Medical history non-contributory    Vaginal Pap smear, abnormal     Past Surgical History:  Procedure Laterality Date   CESAREAN SECTION     CESAREAN SECTION N/A 11/27/2019   Procedure: CESAREAN SECTION;  Surgeon: Griffin Basil, MD;  Location: [facility];  Service: Obstetrics;  Laterality: N/A;   LEEP  2018    The following portions of the patient's history were reviewed and updated as appropriate: allergies, current medications, past family history, past medical history, past social history, past surgical history and problem list.   Health Maintenance:   Pap History  2018: ASCUS/HPV pos> CIN2> LEEP - negative margins 03/2019: Pap/HPV wnl  Not yet had a mammogram  Review of Systems:  Pertinent items noted in HPI and remainder of comprehensive ROS otherwise negative.  Physical Exam:  BP 123/75   Pulse (!) 106   Resp 16   Ht 5\' 9"  (1.753 m)   Wt 221 lb (100.2 kg)   LMP 04/23/2022   Breastfeeding No   BMI 32.64 kg/m  CONSTITUTIONAL: Well-developed, well-nourished female in no acute distress.  HEENT:  Normocephalic, atraumatic. External right and left ear normal. No scleral icterus.  NECK: Normal range of motion, supple, no masses noted on observation SKIN: No rash noted. Not diaphoretic. No erythema. No pallor. MUSCULOSKELETAL: Normal range of motion. No edema noted. NEUROLOGIC: Alert and oriented to person, place, and time. Normal muscle tone coordination. No cranial nerve deficit noted. PSYCHIATRIC: Normal mood and affect. Normal behavior. Normal judgment and thought content.  Breasts: breasts appear normal, no suspicious masses, no skin or  nipple changes or axillary nodes  PELVIC: Normal appearing external genitalia; normal urethral meatus; normal appearing vaginal mucosa and cervix.  No abnormal discharge noted.  Normal uterine size, no other palpable masses, no uterine or adnexal tenderness. Performed in the presence of a chaperone  Labs and Imaging No results found for this or any previous visit (from the past 168 hour(s)). No results found.  Assessment and Plan:   1. Abnormal cervical Papanicolaou smear, unspecified abnormal pap finding Pap with HPV checked in still follow up from LEEP. If normal can return to routine screening -     Cytology - PAP  2. Breakthrough bleeding on Nexplanon Pt due for removal/exchange in September  3. PCB (post coital bleeding) Pap with HPV checked -     Cervicovaginal ancillary only( [place])  4. Breast cancer screening by mammogram -     MM 3D SCREEN BREAST BILATERAL; Future    Routine preventative health maintenance measures emphasized. Please refer to After Visit Summary for other counseling recommendations.   Return in about 7 months (around 12/12/2022) for nexplanon exchange.  Teresa Gunning, MD, [place] for [facility], [place]

## 2022-05-13 ENCOUNTER — Encounter: Payer: Self-pay | Admitting: Obstetrics and Gynecology

## 2022-05-13 ENCOUNTER — Ambulatory Visit: Payer: Medicaid Other | Admitting: Obstetrics and Gynecology

## 2022-05-13 ENCOUNTER — Other Ambulatory Visit (HOSPITAL_COMMUNITY)
Admission: RE | Admit: 2022-05-13 | Discharge: 2022-05-13 | Disposition: A | Discharge status: Home / Self Care | Payer: Medicaid Other | Source: Ambulatory Visit | Attending: Obstetrics and Gynecology | Admitting: Obstetrics and Gynecology

## 2022-05-13 VITALS — BP 123/75 | HR 106 | Resp 16 | Ht 69.0 in | Wt 221.0 lb

## 2022-05-13 DIAGNOSIS — N921 Excessive and frequent menstruation with irregular cycle: Secondary | ICD-10-CM

## 2022-05-13 DIAGNOSIS — R87619 Unspecified abnormal cytological findings in specimens from cervix uteri: Secondary | ICD-10-CM | POA: Diagnosis not present

## 2022-05-13 DIAGNOSIS — B9689 Other specified bacterial agents as the cause of diseases classified elsewhere: Secondary | ICD-10-CM | POA: Diagnosis not present

## 2022-05-13 DIAGNOSIS — Z1231 Encounter for screening mammogram for malignant neoplasm of breast: Secondary | ICD-10-CM

## 2022-05-13 DIAGNOSIS — N930 Postcoital and contact bleeding: Secondary | ICD-10-CM | POA: Diagnosis not present

## 2022-05-13 DIAGNOSIS — N760 Acute vaginitis: Secondary | ICD-10-CM | POA: Diagnosis not present

## 2022-05-13 DIAGNOSIS — Z975 Presence of (intrauterine) contraceptive device: Secondary | ICD-10-CM | POA: Diagnosis not present

## 2022-05-14 LAB — CERVICOVAGINAL ANCILLARY ONLY
Bacterial Vaginitis (gardnerella): POSITIVE — AB
Candida Glabrata: NEGATIVE
Candida Vaginitis: NEGATIVE
Chlamydia: NEGATIVE
Comment: NEGATIVE
Comment: NEGATIVE
Comment: NEGATIVE
Comment: NEGATIVE
Comment: NEGATIVE
Comment: NORMAL
Neisseria Gonorrhea: NEGATIVE
Trichomonas: NEGATIVE

## 2022-05-15 LAB — CYTOLOGY - PAP
Comment: NEGATIVE
Diagnosis: NEGATIVE
High risk HPV: NEGATIVE

## 2022-05-15 MED ORDER — METRONIDAZOLE 500 MG PO TABS: 500.0000 mg | ORAL_TABLET | Freq: Two times a day (BID) | ORAL | 0 refills | Status: DC

## 2022-05-15 NOTE — Addendum Note (Signed)
Addended by: Radene Gunning A on: 05/15/2022 08:08 AM   Modules accepted: Orders

## 2022-05-16 DIAGNOSIS — E559 Vitamin D deficiency, unspecified: Secondary | ICD-10-CM | POA: Diagnosis not present

## 2022-05-16 DIAGNOSIS — Z6832 Body mass index (BMI) 32.0-32.9, adult: Secondary | ICD-10-CM | POA: Diagnosis not present

## 2022-05-16 DIAGNOSIS — E611 Iron deficiency: Secondary | ICD-10-CM | POA: Diagnosis not present

## 2022-05-16 DIAGNOSIS — E669 Obesity, unspecified: Secondary | ICD-10-CM | POA: Diagnosis not present

## 2022-05-16 DIAGNOSIS — Z3200 Encounter for pregnancy test, result unknown: Secondary | ICD-10-CM | POA: Diagnosis not present

## 2022-05-16 DIAGNOSIS — E0590 Thyrotoxicosis, unspecified without thyrotoxic crisis or storm: Secondary | ICD-10-CM | POA: Diagnosis not present

## 2022-05-16 DIAGNOSIS — F17210 Nicotine dependence, cigarettes, uncomplicated: Secondary | ICD-10-CM | POA: Diagnosis not present

## 2022-06-14 DIAGNOSIS — E559 Vitamin D deficiency, unspecified: Secondary | ICD-10-CM | POA: Diagnosis not present

## 2022-06-14 DIAGNOSIS — E0590 Thyrotoxicosis, unspecified without thyrotoxic crisis or storm: Secondary | ICD-10-CM | POA: Diagnosis not present

## 2022-06-14 DIAGNOSIS — E669 Obesity, unspecified: Secondary | ICD-10-CM | POA: Diagnosis not present

## 2022-06-14 DIAGNOSIS — E611 Iron deficiency: Secondary | ICD-10-CM | POA: Diagnosis not present

## 2022-06-14 DIAGNOSIS — Z6833 Body mass index (BMI) 33.0-33.9, adult: Secondary | ICD-10-CM | POA: Diagnosis not present

## 2022-06-14 DIAGNOSIS — F17210 Nicotine dependence, cigarettes, uncomplicated: Secondary | ICD-10-CM | POA: Diagnosis not present

## 2022-06-14 DIAGNOSIS — Z3200 Encounter for pregnancy test, result unknown: Secondary | ICD-10-CM | POA: Diagnosis not present

## 2022-07-08 ENCOUNTER — Inpatient Hospital Stay: Admission: RE | Admit: 2022-07-08 | Payer: Medicaid Other | Source: Ambulatory Visit

## 2022-07-08 DIAGNOSIS — Z13220 Encounter for screening for lipoid disorders: Secondary | ICD-10-CM | POA: Diagnosis not present

## 2022-07-08 DIAGNOSIS — Z6833 Body mass index (BMI) 33.0-33.9, adult: Secondary | ICD-10-CM | POA: Diagnosis not present

## 2022-07-08 DIAGNOSIS — E559 Vitamin D deficiency, unspecified: Secondary | ICD-10-CM | POA: Diagnosis not present

## 2022-07-08 DIAGNOSIS — E669 Obesity, unspecified: Secondary | ICD-10-CM | POA: Diagnosis not present

## 2022-07-08 DIAGNOSIS — F17210 Nicotine dependence, cigarettes, uncomplicated: Secondary | ICD-10-CM | POA: Diagnosis not present

## 2022-07-08 DIAGNOSIS — Z3200 Encounter for pregnancy test, result unknown: Secondary | ICD-10-CM | POA: Diagnosis not present

## 2022-07-08 DIAGNOSIS — E611 Iron deficiency: Secondary | ICD-10-CM | POA: Diagnosis not present

## 2022-07-08 DIAGNOSIS — Z131 Encounter for screening for diabetes mellitus: Secondary | ICD-10-CM | POA: Diagnosis not present

## 2022-07-08 DIAGNOSIS — R748 Abnormal levels of other serum enzymes: Secondary | ICD-10-CM | POA: Diagnosis not present

## 2022-07-08 DIAGNOSIS — K5229 Other allergic and dietetic gastroenteritis and colitis: Secondary | ICD-10-CM | POA: Diagnosis not present

## 2022-07-08 DIAGNOSIS — E0590 Thyrotoxicosis, unspecified without thyrotoxic crisis or storm: Secondary | ICD-10-CM | POA: Diagnosis not present

## 2022-07-08 DIAGNOSIS — Z79899 Other long term (current) drug therapy: Secondary | ICD-10-CM | POA: Diagnosis not present

## 2022-07-13 ENCOUNTER — Ambulatory Visit
Admission: RE | Admit: 2022-07-13 | Discharge: 2022-07-13 | Disposition: A | Discharge status: Home / Self Care | Payer: Medicaid Other | Source: Ambulatory Visit | Attending: Obstetrics and Gynecology | Admitting: Obstetrics and Gynecology

## 2022-07-13 DIAGNOSIS — Z1231 Encounter for screening mammogram for malignant neoplasm of breast: Secondary | ICD-10-CM | POA: Diagnosis not present

## 2022-07-31 ENCOUNTER — Ambulatory Visit: Payer: Medicaid Other

## 2022-08-05 DIAGNOSIS — Z6834 Body mass index (BMI) 34.0-34.9, adult: Secondary | ICD-10-CM | POA: Diagnosis not present

## 2022-08-05 DIAGNOSIS — E559 Vitamin D deficiency, unspecified: Secondary | ICD-10-CM | POA: Diagnosis not present

## 2022-08-05 DIAGNOSIS — Z3200 Encounter for pregnancy test, result unknown: Secondary | ICD-10-CM | POA: Diagnosis not present

## 2022-08-05 DIAGNOSIS — E0590 Thyrotoxicosis, unspecified without thyrotoxic crisis or storm: Secondary | ICD-10-CM | POA: Diagnosis not present

## 2022-08-05 DIAGNOSIS — F17210 Nicotine dependence, cigarettes, uncomplicated: Secondary | ICD-10-CM | POA: Diagnosis not present

## 2022-08-05 DIAGNOSIS — R748 Abnormal levels of other serum enzymes: Secondary | ICD-10-CM | POA: Diagnosis not present

## 2022-08-05 DIAGNOSIS — E669 Obesity, unspecified: Secondary | ICD-10-CM | POA: Diagnosis not present

## 2022-09-10 DIAGNOSIS — E0500 Thyrotoxicosis with diffuse goiter without thyrotoxic crisis or storm: Secondary | ICD-10-CM | POA: Diagnosis not present

## 2022-09-20 DIAGNOSIS — E669 Obesity, unspecified: Secondary | ICD-10-CM | POA: Diagnosis not present

## 2022-09-20 DIAGNOSIS — Z6834 Body mass index (BMI) 34.0-34.9, adult: Secondary | ICD-10-CM | POA: Diagnosis not present

## 2022-09-20 DIAGNOSIS — R0602 Shortness of breath: Secondary | ICD-10-CM | POA: Diagnosis not present

## 2022-09-20 DIAGNOSIS — F17210 Nicotine dependence, cigarettes, uncomplicated: Secondary | ICD-10-CM | POA: Diagnosis not present

## 2022-09-20 DIAGNOSIS — Z3200 Encounter for pregnancy test, result unknown: Secondary | ICD-10-CM | POA: Diagnosis not present

## 2022-10-28 DIAGNOSIS — E559 Vitamin D deficiency, unspecified: Secondary | ICD-10-CM | POA: Diagnosis not present

## 2022-10-28 DIAGNOSIS — E538 Deficiency of other specified B group vitamins: Secondary | ICD-10-CM | POA: Diagnosis not present

## 2022-10-28 DIAGNOSIS — Z6834 Body mass index (BMI) 34.0-34.9, adult: Secondary | ICD-10-CM | POA: Diagnosis not present

## 2022-10-28 DIAGNOSIS — R0602 Shortness of breath: Secondary | ICD-10-CM | POA: Diagnosis not present

## 2022-10-28 DIAGNOSIS — E7800 Pure hypercholesterolemia, unspecified: Secondary | ICD-10-CM | POA: Diagnosis not present

## 2022-10-28 DIAGNOSIS — R635 Abnormal weight gain: Secondary | ICD-10-CM | POA: Diagnosis not present

## 2022-10-28 DIAGNOSIS — R5383 Other fatigue: Secondary | ICD-10-CM | POA: Diagnosis not present

## 2022-10-28 DIAGNOSIS — Z3200 Encounter for pregnancy test, result unknown: Secondary | ICD-10-CM | POA: Diagnosis not present

## 2022-10-28 DIAGNOSIS — Z79899 Other long term (current) drug therapy: Secondary | ICD-10-CM | POA: Diagnosis not present

## 2022-10-28 DIAGNOSIS — H029 Unspecified disorder of eyelid: Secondary | ICD-10-CM | POA: Diagnosis not present

## 2022-11-25 DIAGNOSIS — Z3200 Encounter for pregnancy test, result unknown: Secondary | ICD-10-CM | POA: Diagnosis not present

## 2022-11-25 DIAGNOSIS — E6609 Other obesity due to excess calories: Secondary | ICD-10-CM | POA: Diagnosis not present

## 2022-11-25 DIAGNOSIS — D509 Iron deficiency anemia, unspecified: Secondary | ICD-10-CM | POA: Diagnosis not present

## 2022-11-25 DIAGNOSIS — Z6834 Body mass index (BMI) 34.0-34.9, adult: Secondary | ICD-10-CM | POA: Diagnosis not present

## 2022-11-28 ENCOUNTER — Other Ambulatory Visit (HOSPITAL_BASED_OUTPATIENT_CLINIC_OR_DEPARTMENT_OTHER): Payer: Self-pay

## 2022-11-28 MED ORDER — SEMAGLUTIDE-WEIGHT MANAGEMENT 0.25 MG/0.5ML ~~LOC~~ SOAJ
0.2500 mg | SUBCUTANEOUS | 0 refills | Status: AC
  Filled 2022-11-28: qty 2, 28d supply, fill #0

## 2022-12-09 DIAGNOSIS — H0266 Xanthelasma of left eye, unspecified eyelid: Secondary | ICD-10-CM | POA: Diagnosis not present

## 2022-12-09 DIAGNOSIS — H0263 Xanthelasma of right eye, unspecified eyelid: Secondary | ICD-10-CM | POA: Diagnosis not present

## 2022-12-25 ENCOUNTER — Other Ambulatory Visit (HOSPITAL_BASED_OUTPATIENT_CLINIC_OR_DEPARTMENT_OTHER): Payer: Self-pay

## 2022-12-25 DIAGNOSIS — Z3200 Encounter for pregnancy test, result unknown: Secondary | ICD-10-CM | POA: Diagnosis not present

## 2022-12-25 DIAGNOSIS — E6609 Other obesity due to excess calories: Secondary | ICD-10-CM | POA: Diagnosis not present

## 2022-12-25 DIAGNOSIS — Z6834 Body mass index (BMI) 34.0-34.9, adult: Secondary | ICD-10-CM | POA: Diagnosis not present

## 2022-12-25 DIAGNOSIS — D539 Nutritional anemia, unspecified: Secondary | ICD-10-CM | POA: Diagnosis not present

## 2022-12-25 MED ORDER — WEGOVY 0.5 MG/0.5ML ~~LOC~~ SOAJ
SUBCUTANEOUS | 0 refills | Status: AC
  Filled 2022-12-25: qty 2, 28d supply, fill #0

## 2023-01-05 NOTE — Progress Notes (Unsigned)
b  GYNECOLOGY OFFICE VISIT NOTE  History:   Teresa Sanchez is a 42 y.o. [id number] here today for Nexplanon removal and discussion of other birth control.    She had done pills as a teenager and didn't like how they made her feel. She does not want an IUD. She gained weight on Depo. She is curious about any other options.   She denies any abnormal vaginal discharge, bleeding, pelvic pain or other concerns.     Past Medical History:  Diagnosis Date   Hypertension    Medical history non-contributory    Vaginal Pap smear, abnormal     Past Surgical History:  Procedure Laterality Date   CESAREAN SECTION     CESAREAN SECTION N/A 11/27/2019   Procedure: CESAREAN SECTION;  Surgeon: Warden Fillers, MD;  Location: [facility] LD [facility]S;  Service: Obstetrics;  Laterality: N/A;   LEEP  2018    The following portions of the patient's history were reviewed and updated as appropriate: allergies, current medications, past family history, past medical history, past social history, past surgical history and problem list.   Health Maintenance:   Diagnosis  Date Value Ref Range Status  05/13/2022   Final   - Negative for intraepithelial lesion or malignancy (NILM)   Review of Systems:  Pertinent items noted in HPI and remainder of comprehensive ROS otherwise negative.  Physical Exam:  BP 116/80   Pulse (!) 105   Ht 5\' 9"  (1.753 m)   Wt 233 lb (105.7 kg)   BMI 34.41 kg/m  CONSTITUTIONAL: Well-developed, well-nourished female in no acute distress.  HEENT:  Normocephalic, atraumatic. External right and left ear normal. No scleral icterus.  NECK: Normal range of motion, supple, no masses noted on observation SKIN: No rash noted. Not diaphoretic. No erythema. No pallor. MUSCULOSKELETAL: Normal range of motion. No edema noted. NEUROLOGIC: Alert and oriented to person, place, and time. Normal muscle tone coordination. No cranial nerve deficit noted. PSYCHIATRIC: Normal mood and affect. Normal  behavior. Normal judgment and thought content.    PELVIC: Deferred   GYNECOLOGY OFFICE PROCEDURE NOTE  Nexplanon Removal and Reinsertion Patient identified, informed consent performed, consent signed.   Patient does understand that irregular bleeding is a very common side effect of this medication. She was advised to have backup contraception for one week after replacement of the implant.   Appropriate time out taken. Nexplanon site identified in left arm.  Area prepped in usual sterile fashon. One ml of 1% lidocaine was used to anesthetize the area at the distal end of the implant. A small stab incision was made right beside the implant on the distal portion. The Nexplanon rod was grasped and removed without difficulty. There was minimal blood loss. There were no complications. Area on lower arm per new manufacturer recommendations was then injected with 3 ml of 1 % lidocaine. She was re-prepped with betadine, Nexplanon removed from packaging, Device confirmed in needle, then inserted full length of needle and withdrawn per handbook instructions. Nexplanon was able to palpated in the patient's arm; patient declined palpating the insert herself.  There was minimal blood loss. Patient insertion site covered with gauze and a pressure bandage to reduce any bruising.      Assessment and Plan:   1. Birth control counseling Discussed options available remaining would be Nexplanon and POP. Discussed risks/benefits of each. Reviewed with tobacco use, I would not recommend combined OCP.  After counseling, she decided to have nexplanon placed again today.  2. Encounter for removal and reinsertion of Nexplanon Nexplanon exchanged today. Reviewed care instructions for next 72 hours.    Routine preventative health maintenance measures emphasized. Please refer to After Visit Summary for other counseling recommendations.   Return if symptoms worsen or fail to improve.  Milas Hock, MD,  FACOG Obstetrician & Gynecologist, [facility] for [facility], [facility]

## 2023-01-06 ENCOUNTER — Ambulatory Visit: Payer: Medicaid Other | Admitting: Obstetrics and Gynecology

## 2023-01-06 ENCOUNTER — Encounter: Payer: Self-pay | Admitting: Obstetrics and Gynecology

## 2023-01-06 VITALS — BP 116/80 | HR 105 | Ht 69.0 in | Wt 233.0 lb

## 2023-01-06 DIAGNOSIS — Z3046 Encounter for surveillance of implantable subdermal contraceptive: Secondary | ICD-10-CM

## 2023-01-06 DIAGNOSIS — Z3009 Encounter for other general counseling and advice on contraception: Secondary | ICD-10-CM

## 2023-01-06 MED ORDER — ETONOGESTREL 68 MG ~~LOC~~ IMPL
68.0000 mg | DRUG_IMPLANT | Freq: Once | SUBCUTANEOUS | Status: AC
  Administered 2023-01-06: 68 mg via SUBCUTANEOUS

## 2023-01-27 ENCOUNTER — Other Ambulatory Visit (HOSPITAL_BASED_OUTPATIENT_CLINIC_OR_DEPARTMENT_OTHER): Payer: Self-pay

## 2023-01-27 DIAGNOSIS — Z760 Encounter for issue of repeat prescription: Secondary | ICD-10-CM | POA: Diagnosis not present

## 2023-01-27 DIAGNOSIS — E6609 Other obesity due to excess calories: Secondary | ICD-10-CM | POA: Diagnosis not present

## 2023-01-27 DIAGNOSIS — Z3200 Encounter for pregnancy test, result unknown: Secondary | ICD-10-CM | POA: Diagnosis not present

## 2023-01-27 DIAGNOSIS — E611 Iron deficiency: Secondary | ICD-10-CM | POA: Diagnosis not present

## 2023-01-27 DIAGNOSIS — Z6834 Body mass index (BMI) 34.0-34.9, adult: Secondary | ICD-10-CM | POA: Diagnosis not present

## 2023-01-27 MED ORDER — ACCRUFER 30 MG PO CAPS
30.0000 mg | ORAL_CAPSULE | Freq: Every day | ORAL | 5 refills | Status: AC
  Filled 2023-01-27 – 2023-04-15 (×3): qty 30, 30d supply, fill #0

## 2023-01-27 MED ORDER — WEGOVY 1 MG/0.5ML ~~LOC~~ SOAJ
1.0000 mg | SUBCUTANEOUS | 0 refills | Status: DC
  Filled 2023-01-27: qty 2, 28d supply, fill #0

## 2023-01-27 MED ORDER — VITAMIN D (ERGOCALCIFEROL) 1.25 MG (50000 UNIT) PO CAPS
50000.0000 [IU] | ORAL_CAPSULE | ORAL | 5 refills | Status: AC
  Filled 2023-01-27 – 2023-04-15 (×3): qty 4, 28d supply, fill #0

## 2023-01-27 MED ORDER — SEMAGLUTIDE-WEIGHT MANAGEMENT 1 MG/0.5ML ~~LOC~~ SOAJ
1.0000 mg | SUBCUTANEOUS | 0 refills | Status: AC
  Filled 2023-01-27: qty 2, 28d supply, fill #0

## 2023-01-29 ENCOUNTER — Other Ambulatory Visit (HOSPITAL_BASED_OUTPATIENT_CLINIC_OR_DEPARTMENT_OTHER): Payer: Self-pay

## 2023-02-01 ENCOUNTER — Other Ambulatory Visit (HOSPITAL_BASED_OUTPATIENT_CLINIC_OR_DEPARTMENT_OTHER): Payer: Self-pay

## 2023-02-06 ENCOUNTER — Encounter (HOSPITAL_BASED_OUTPATIENT_CLINIC_OR_DEPARTMENT_OTHER): Payer: Self-pay | Admitting: Emergency Medicine

## 2023-02-06 ENCOUNTER — Other Ambulatory Visit: Payer: Self-pay

## 2023-02-06 ENCOUNTER — Emergency Department (HOSPITAL_BASED_OUTPATIENT_CLINIC_OR_DEPARTMENT_OTHER)
Admission: EM | Admit: 2023-02-06 | Discharge: 2023-02-06 | Disposition: A | Discharge status: Home / Self Care | Payer: Medicaid Other | Attending: Emergency Medicine | Admitting: Emergency Medicine

## 2023-02-06 DIAGNOSIS — R7401 Elevation of levels of liver transaminase levels: Secondary | ICD-10-CM | POA: Insufficient documentation

## 2023-02-06 DIAGNOSIS — L299 Pruritus, unspecified: Secondary | ICD-10-CM

## 2023-02-06 DIAGNOSIS — R7989 Other specified abnormal findings of blood chemistry: Secondary | ICD-10-CM

## 2023-02-06 DIAGNOSIS — R197 Diarrhea, unspecified: Secondary | ICD-10-CM | POA: Insufficient documentation

## 2023-02-06 LAB — CBC WITH DIFFERENTIAL/PLATELET
Abs Immature Granulocytes: 0.02 10*3/uL (ref 0.00–0.07)
Basophils Absolute: 0 10*3/uL (ref 0.0–0.1)
Basophils Relative: 0 %
Eosinophils Absolute: 0.1 10*3/uL (ref 0.0–0.5)
Eosinophils Relative: 2 %
HCT: 34.9 % — ABNORMAL LOW (ref 36.0–46.0)
Hemoglobin: 11.5 g/dL — ABNORMAL LOW (ref 12.0–15.0)
Immature Granulocytes: 0 %
Lymphocytes Relative: 48 %
Lymphs Abs: 2.3 10*3/uL (ref 0.7–4.0)
MCH: 29.1 pg (ref 26.0–34.0)
MCHC: 33 g/dL (ref 30.0–36.0)
MCV: 88.4 fL (ref 80.0–100.0)
Monocytes Absolute: 0.5 10*3/uL (ref 0.1–1.0)
Monocytes Relative: 9 %
Neutro Abs: 2 10*3/uL (ref 1.7–7.7)
Neutrophils Relative %: 41 %
Platelets: 243 10*3/uL (ref 150–400)
RBC: 3.95 MIL/uL (ref 3.87–5.11)
RDW: 12.2 % (ref 11.5–15.5)
WBC: 4.9 10*3/uL (ref 4.0–10.5)
nRBC: 0 % (ref 0.0–0.2)

## 2023-02-06 LAB — URINALYSIS, ROUTINE W REFLEX MICROSCOPIC
Glucose, UA: NEGATIVE mg/dL
Hgb urine dipstick: NEGATIVE
Ketones, ur: NEGATIVE mg/dL
Leukocytes,Ua: NEGATIVE
Nitrite: NEGATIVE
Protein, ur: NEGATIVE mg/dL
Specific Gravity, Urine: >=1.03 (ref 1.005–1.030)
pH: 6 (ref 5.0–8.0)

## 2023-02-06 LAB — COMPREHENSIVE METABOLIC PANEL
ALT: 95 U/L — ABNORMAL HIGH (ref 0–44)
AST: 52 U/L — ABNORMAL HIGH (ref 15–41)
Albumin: 3.6 g/dL (ref 3.5–5.0)
Alkaline Phosphatase: 222 U/L — ABNORMAL HIGH (ref 38–126)
Anion gap: 9 (ref 5–15)
BUN: 10 mg/dL (ref 6–20)
CO2: 24 mmol/L (ref 22–32)
Calcium: 9.2 mg/dL (ref 8.9–10.3)
Chloride: 103 mmol/L (ref 98–111)
Creatinine, Ser: 0.71 mg/dL (ref 0.44–1.00)
GFR, Estimated: >60 mL/min (ref >60)
Glucose, Bld: 107 mg/dL — ABNORMAL HIGH (ref 70–99)
Potassium: 3.5 mmol/L (ref 3.5–5.1)
Sodium: 136 mmol/L (ref 135–145)
Total Bilirubin: 1.4 mg/dL — ABNORMAL HIGH (ref <1.2)
Total Protein: 7.7 g/dL (ref 6.5–8.1)

## 2023-02-06 LAB — PREGNANCY, URINE: Preg Test, Ur: NEGATIVE

## 2023-02-06 MED ORDER — HYDROXYZINE HCL 25 MG PO TABS
25.0000 mg | ORAL_TABLET | Freq: Once | ORAL | Status: AC
  Administered 2023-02-06: 25 mg via ORAL
  Filled 2023-02-06: qty 1

## 2023-02-06 MED ORDER — HYDROXYZINE HCL 25 MG PO TABS: 25.0000 mg | ORAL_TABLET | Freq: Four times a day (QID) | ORAL | 0 refills | Status: AC | PRN

## 2023-02-06 NOTE — Discharge Instructions (Signed)
Begin taking hydroxyzine as prescribed as needed for itching.  Follow-up with your primary doctor later this week for a repeat of your liver enzyme studies.  Return to the ER if you develop high fevers, severe abdominal pain, or for other new and concerning symptoms.

## 2023-02-06 NOTE — ED Triage Notes (Signed)
Pt states has Hyperthyroid and graves disease, Hx of elevated liver test.  drank one glass of wine and started Itching, Unable to control at home. Also started wegovy, took first two doses and is unable to find third dose.

## 2023-02-06 NOTE — ED Provider Notes (Signed)
[place] [facility] Provider Note   CSN: [id number] Arrival date & time: 02/06/23  0535     History  Chief Complaint  Patient presents with   Pruritis    Teresa Sanchez is a 42 y.o. female.  Patient is a 42 year old female with history of hyperthyroidism/Graves' disease.  Patient presenting today for evaluation of itching.  She reports drinking a glass of red wine several nights ago and has been itching since.  She is concerned that her liver enzymes may be elevated.  She also reports having urine that is a very concentrated  yellow.  She also reports some loose stool.  No fevers or chills.  Patient tells me that she recently started taking Wegovy.  The history is provided by the patient.       Home Medications Prior to Admission medications   Medication Sig Start Date End Date Taking? Authorizing Provider  Ferric Maltol (ACCRUFER) 30 MG CAPS Take 1 capsule (30 mg total) by mouth daily. 01/27/23     methimazole (TAPAZOLE) 10 MG tablet Take 1 tablet by mouth daily. 05/10/22   [provider]  Semaglutide-Weight Management (WEGOVY) 0.5 MG/0.5ML SOAJ Inject 0.5mg  each week. 12/25/22     Semaglutide-Weight Management 0.25 MG/0.5ML SOAJ Inject 0.25 mg into the skin once a week. 11/25/22     Semaglutide-Weight Management 1 MG/0.5ML SOAJ Inject 1 mg into the skin once a week. 01/27/23     Vitamin D, Ergocalciferol, (DRISDOL) 1.25 MG (50000 UNIT) CAPS capsule Take 50,000 Units by mouth once a week. 05/10/22   [provider]  Vitamin D, Ergocalciferol, (DRISDOL) 1.25 MG (50000 UNIT) CAPS capsule Take 1 capsule (50,000 Units total) by mouth once a week. 01/27/23         Allergies    Patient has no known allergies.    Review of Systems   Review of Systems  All other systems reviewed and are negative.   Physical Exam Updated Vital Signs BP (!) 120/91 (BP Location: Left Arm)   Pulse 98   Temp 98.3 F (36.8 C) (Oral)   Resp  18   Ht 5\' 9"  (1.753 m)   Wt 102.1 kg   SpO2 100%   BMI 33.23 kg/m  Physical Exam Vitals and nursing note reviewed.  Constitutional:      General: She is not in acute distress.    Appearance: She is well-developed. She is not diaphoretic.  HENT:     Head: Normocephalic and atraumatic.  Cardiovascular:     Rate and Rhythm: Normal rate and regular rhythm.     Heart sounds: No murmur heard.    No friction rub. No gallop.  Pulmonary:     Effort: Pulmonary effort is normal. No respiratory distress.     Breath sounds: Normal breath sounds. No wheezing.  Abdominal:     General: Bowel sounds are normal. There is no distension.     Palpations: Abdomen is soft.     Tenderness: There is no abdominal tenderness.  Musculoskeletal:        General: Normal range of motion.     Cervical back: Normal range of motion and neck supple.  Skin:    General: Skin is warm and dry.  Neurological:     General: No focal deficit present.     Mental Status: She is alert and oriented to person, place, and time.     ED Results / Procedures / Treatments   Labs (all labs ordered  are listed, but only abnormal results are displayed) Labs Reviewed  COMPREHENSIVE METABOLIC PANEL  CBC WITH DIFFERENTIAL/PLATELET  URINALYSIS, ROUTINE W REFLEX MICROSCOPIC  PREGNANCY, URINE    EKG None  Radiology No results found.  Procedures Procedures    Medications Ordered in ED Medications  hydrOXYzine (ATARAX) tablet 25 mg (has no administration in time range)    ED Course/ Medical Decision Making/ A&P  Patient is a 42 year old female presenting with itching and other complaints as described in the HPI.  She is concerned that her liver enzymes may be elevated because they have been elevated in the past.  She arrives here with stable vital signs and physical examination which is basically unremarkable.  She is afebrile.  Laboratory studies obtained including CBC, CMP, urinalysis.  She does have mild  elevations of her transaminases, alk phos, and total bilirubin, but not to significant levels.  Urinalysis is clear and pregnancy test negative.  Cause of the enzyme elevation unclear, but could be related to treatment with [facility] which she started in September.  At this point, I see nothing emergent that needs to be done.  She is not having any abdominal pain or tenderness and I highly doubt a gallstone as the cause.  I will have patient follow-up with her primary doctor later this week and have her enzymes rechecked.  She is to return in the meantime if she develops abdominal pain, fever, or other issues.  She also reports generalized itching for which I will prescribe hydroxyzine.  Final Clinical Impression(s) / ED Diagnoses Final diagnoses:  None    Rx / DC Orders ED Discharge Orders     None         Geoffery Lyons, MD 02/06/23 [phone]

## 2023-02-09 ENCOUNTER — Other Ambulatory Visit (HOSPITAL_BASED_OUTPATIENT_CLINIC_OR_DEPARTMENT_OTHER): Payer: Self-pay

## 2023-02-15 DIAGNOSIS — R748 Abnormal levels of other serum enzymes: Secondary | ICD-10-CM | POA: Diagnosis not present

## 2023-03-03 DIAGNOSIS — Z3200 Encounter for pregnancy test, result unknown: Secondary | ICD-10-CM | POA: Diagnosis not present

## 2023-03-03 DIAGNOSIS — E6609 Other obesity due to excess calories: Secondary | ICD-10-CM | POA: Diagnosis not present

## 2023-03-03 DIAGNOSIS — Z6834 Body mass index (BMI) 34.0-34.9, adult: Secondary | ICD-10-CM | POA: Diagnosis not present

## 2023-03-03 DIAGNOSIS — E611 Iron deficiency: Secondary | ICD-10-CM | POA: Diagnosis not present

## 2023-03-10 DIAGNOSIS — K8020 Calculus of gallbladder without cholecystitis without obstruction: Secondary | ICD-10-CM | POA: Diagnosis not present

## 2023-03-10 DIAGNOSIS — R748 Abnormal levels of other serum enzymes: Secondary | ICD-10-CM | POA: Diagnosis not present

## 2023-03-12 DIAGNOSIS — E0590 Thyrotoxicosis, unspecified without thyrotoxic crisis or storm: Secondary | ICD-10-CM | POA: Diagnosis not present

## 2023-03-12 DIAGNOSIS — E0500 Thyrotoxicosis with diffuse goiter without thyrotoxic crisis or storm: Secondary | ICD-10-CM | POA: Diagnosis not present

## 2023-03-13 ENCOUNTER — Other Ambulatory Visit (HOSPITAL_COMMUNITY): Payer: Self-pay

## 2023-03-13 ENCOUNTER — Other Ambulatory Visit: Payer: Self-pay

## 2023-03-16 ENCOUNTER — Encounter: Payer: Self-pay | Admitting: Pharmacist

## 2023-03-16 ENCOUNTER — Other Ambulatory Visit: Payer: Self-pay

## 2023-03-19 ENCOUNTER — Other Ambulatory Visit: Payer: Self-pay

## 2023-04-03 DIAGNOSIS — E611 Iron deficiency: Secondary | ICD-10-CM | POA: Diagnosis not present

## 2023-04-03 DIAGNOSIS — Z6834 Body mass index (BMI) 34.0-34.9, adult: Secondary | ICD-10-CM | POA: Diagnosis not present

## 2023-04-03 DIAGNOSIS — Z3200 Encounter for pregnancy test, result unknown: Secondary | ICD-10-CM | POA: Diagnosis not present

## 2023-04-03 DIAGNOSIS — E6609 Other obesity due to excess calories: Secondary | ICD-10-CM | POA: Diagnosis not present

## 2023-04-03 DIAGNOSIS — E0590 Thyrotoxicosis, unspecified without thyrotoxic crisis or storm: Secondary | ICD-10-CM | POA: Diagnosis not present

## 2023-04-03 DIAGNOSIS — D539 Nutritional anemia, unspecified: Secondary | ICD-10-CM | POA: Diagnosis not present

## 2023-04-15 ENCOUNTER — Encounter (HOSPITAL_COMMUNITY): Payer: Self-pay

## 2023-04-16 ENCOUNTER — Other Ambulatory Visit (HOSPITAL_COMMUNITY): Payer: Self-pay

## 2023-04-16 ENCOUNTER — Other Ambulatory Visit: Payer: Self-pay

## 2023-05-02 DIAGNOSIS — L989 Disorder of the skin and subcutaneous tissue, unspecified: Secondary | ICD-10-CM | POA: Diagnosis not present

## 2023-05-02 DIAGNOSIS — E6609 Other obesity due to excess calories: Secondary | ICD-10-CM | POA: Diagnosis not present

## 2023-05-02 DIAGNOSIS — Z3200 Encounter for pregnancy test, result unknown: Secondary | ICD-10-CM | POA: Diagnosis not present

## 2023-05-02 DIAGNOSIS — Z6834 Body mass index (BMI) 34.0-34.9, adult: Secondary | ICD-10-CM | POA: Diagnosis not present

## 2023-05-06 ENCOUNTER — Encounter: Payer: Self-pay | Admitting: Pharmacist

## 2023-05-06 ENCOUNTER — Other Ambulatory Visit: Payer: Self-pay

## 2023-05-11 ENCOUNTER — Other Ambulatory Visit: Payer: Self-pay

## 2023-05-19 DIAGNOSIS — R748 Abnormal levels of other serum enzymes: Secondary | ICD-10-CM | POA: Diagnosis not present

## 2023-05-30 DIAGNOSIS — Z3200 Encounter for pregnancy test, result unknown: Secondary | ICD-10-CM | POA: Diagnosis not present

## 2023-05-30 DIAGNOSIS — Z6834 Body mass index (BMI) 34.0-34.9, adult: Secondary | ICD-10-CM | POA: Diagnosis not present

## 2023-05-30 DIAGNOSIS — E6609 Other obesity due to excess calories: Secondary | ICD-10-CM | POA: Diagnosis not present

## 2023-06-24 DIAGNOSIS — L650 Telogen effluvium: Secondary | ICD-10-CM | POA: Diagnosis not present

## 2023-06-24 DIAGNOSIS — G8929 Other chronic pain: Secondary | ICD-10-CM | POA: Diagnosis not present

## 2023-06-24 DIAGNOSIS — M545 Low back pain, unspecified: Secondary | ICD-10-CM | POA: Diagnosis not present

## 2023-06-24 DIAGNOSIS — L659 Nonscarring hair loss, unspecified: Secondary | ICD-10-CM | POA: Diagnosis not present

## 2023-06-30 DIAGNOSIS — Z6834 Body mass index (BMI) 34.0-34.9, adult: Secondary | ICD-10-CM | POA: Diagnosis not present

## 2023-06-30 DIAGNOSIS — E6609 Other obesity due to excess calories: Secondary | ICD-10-CM | POA: Diagnosis not present

## 2023-06-30 DIAGNOSIS — Z3200 Encounter for pregnancy test, result unknown: Secondary | ICD-10-CM | POA: Diagnosis not present

## 2023-06-30 DIAGNOSIS — Z760 Encounter for issue of repeat prescription: Secondary | ICD-10-CM | POA: Diagnosis not present

## 2023-07-26 DIAGNOSIS — L650 Telogen effluvium: Secondary | ICD-10-CM | POA: Diagnosis not present

## 2023-07-26 DIAGNOSIS — L219 Seborrheic dermatitis, unspecified: Secondary | ICD-10-CM | POA: Diagnosis not present

## 2023-07-27 DIAGNOSIS — E6609 Other obesity due to excess calories: Secondary | ICD-10-CM | POA: Diagnosis not present

## 2023-07-27 DIAGNOSIS — Z760 Encounter for issue of repeat prescription: Secondary | ICD-10-CM | POA: Diagnosis not present

## 2023-07-27 DIAGNOSIS — D539 Nutritional anemia, unspecified: Secondary | ICD-10-CM | POA: Diagnosis not present

## 2023-07-27 DIAGNOSIS — Z6834 Body mass index (BMI) 34.0-34.9, adult: Secondary | ICD-10-CM | POA: Diagnosis not present

## 2023-07-27 DIAGNOSIS — Z3200 Encounter for pregnancy test, result unknown: Secondary | ICD-10-CM | POA: Diagnosis not present
